# Patient Record
Sex: Male | Born: 1960 | Race: White | Hispanic: No | Marital: Married | Smoking: Never smoker
Health system: Southern US, Community
[De-identification: ages and names within clinical notes are randomized; demographics above are authoritative.]

## PROBLEM LIST (undated history)

## (undated) DIAGNOSIS — K219 Gastro-esophageal reflux disease without esophagitis: Secondary | ICD-10-CM

## (undated) DIAGNOSIS — G510 Bell's palsy: Secondary | ICD-10-CM

## (undated) DIAGNOSIS — I10 Essential (primary) hypertension: Secondary | ICD-10-CM

## (undated) DIAGNOSIS — F419 Anxiety disorder, unspecified: Secondary | ICD-10-CM

## (undated) HISTORY — DX: Gastro-esophageal reflux disease without esophagitis: K21.9

## (undated) HISTORY — DX: Bell's palsy: G51.0

## (undated) HISTORY — PX: OTHER SURGICAL HISTORY: SHX169

## (undated) HISTORY — DX: Essential (primary) hypertension: I10

---

## 2004-06-28 ENCOUNTER — Emergency Department (HOSPITAL_COMMUNITY): Admission: EM | Admit: 2004-06-28 | Discharge: 2004-06-28 | Payer: Self-pay | Admitting: Family Medicine

## 2007-12-03 ENCOUNTER — Emergency Department (HOSPITAL_COMMUNITY): Admission: EM | Admit: 2007-12-03 | Discharge: 2007-12-03 | Payer: Self-pay | Admitting: Family Medicine

## 2010-03-20 DIAGNOSIS — G510 Bell's palsy: Secondary | ICD-10-CM

## 2010-03-20 HISTORY — DX: Bell's palsy: G51.0

## 2010-03-28 ENCOUNTER — Encounter: Payer: Self-pay | Admitting: Family Medicine

## 2010-03-28 ENCOUNTER — Emergency Department (HOSPITAL_COMMUNITY): Admission: EM | Admit: 2010-03-28 | Discharge: 2010-03-29 | Payer: Self-pay | Admitting: Emergency Medicine

## 2010-03-31 ENCOUNTER — Encounter: Payer: Self-pay | Admitting: Family Medicine

## 2010-03-31 ENCOUNTER — Ambulatory Visit: Payer: Self-pay | Admitting: Internal Medicine

## 2010-03-31 DIAGNOSIS — R1013 Epigastric pain: Secondary | ICD-10-CM | POA: Insufficient documentation

## 2010-04-05 ENCOUNTER — Telehealth: Payer: Self-pay | Admitting: Family Medicine

## 2010-04-06 ENCOUNTER — Ambulatory Visit: Payer: Self-pay | Admitting: Internal Medicine

## 2010-04-06 DIAGNOSIS — G510 Bell's palsy: Secondary | ICD-10-CM | POA: Insufficient documentation

## 2010-05-07 ENCOUNTER — Ambulatory Visit: Payer: Self-pay | Admitting: Internal Medicine

## 2010-05-07 DIAGNOSIS — E669 Obesity, unspecified: Secondary | ICD-10-CM

## 2010-05-07 DIAGNOSIS — E66811 Obesity, class 1: Secondary | ICD-10-CM | POA: Insufficient documentation

## 2010-05-07 DIAGNOSIS — I10 Essential (primary) hypertension: Secondary | ICD-10-CM

## 2010-05-20 ENCOUNTER — Telehealth (INDEPENDENT_AMBULATORY_CARE_PROVIDER_SITE_OTHER): Payer: Self-pay | Admitting: *Deleted

## 2010-05-24 ENCOUNTER — Ambulatory Visit: Payer: Self-pay | Admitting: Family Medicine

## 2010-05-25 ENCOUNTER — Telehealth: Payer: Self-pay | Admitting: Family Medicine

## 2010-05-25 LAB — CONVERTED CEMR LAB
ALT: 50 units/L (ref 0–53)
AST: 31 units/L (ref 0–37)
Albumin: 3.9 g/dL (ref 3.5–5.2)
Alkaline Phosphatase: 77 units/L (ref 39–117)
BUN: 15 mg/dL (ref 6–23)
Bilirubin, Direct: 0.2 mg/dL (ref 0.0–0.3)
CO2: 28 meq/L (ref 19–32)
Calcium: 9.3 mg/dL (ref 8.4–10.5)
Chloride: 101 meq/L (ref 96–112)
Cholesterol: 169 mg/dL (ref 0–200)
Creatinine, Ser: 1.3 mg/dL (ref 0.4–1.5)
GFR calc non Af Amer: 62.28 mL/min (ref >30.00)
Glucose, Bld: 102 mg/dL — ABNORMAL HIGH (ref 70–99)
HDL: 30.8 mg/dL — ABNORMAL LOW (ref >39.00)
LDL Cholesterol: 102 mg/dL — ABNORMAL HIGH (ref 0–99)
Potassium: 3.7 meq/L (ref 3.5–5.1)
Sodium: 138 meq/L (ref 135–145)
TSH: 1.1 microintl units/mL (ref 0.35–5.50)
Total Bilirubin: 1.5 mg/dL — ABNORMAL HIGH (ref 0.3–1.2)
Total CHOL/HDL Ratio: 5
Total Protein: 6.4 g/dL (ref 6.0–8.3)
Triglycerides: 181 mg/dL — ABNORMAL HIGH (ref 0.0–149.0)
VLDL: 36.2 mg/dL (ref 0.0–40.0)

## 2010-06-07 ENCOUNTER — Ambulatory Visit: Payer: Self-pay | Admitting: Family Medicine

## 2010-06-07 DIAGNOSIS — E785 Hyperlipidemia, unspecified: Secondary | ICD-10-CM | POA: Insufficient documentation

## 2010-06-07 LAB — CONVERTED CEMR LAB

## 2010-06-08 LAB — CONVERTED CEMR LAB
BUN: 13 mg/dL (ref 6–23)
CO2: 30 meq/L (ref 19–32)
Calcium: 9.8 mg/dL (ref 8.4–10.5)
Chloride: 101 meq/L (ref 96–112)
Creatinine, Ser: 1.2 mg/dL (ref 0.4–1.5)
GFR calc non Af Amer: 67.69 mL/min (ref >60.00)
Glucose, Bld: 113 mg/dL — ABNORMAL HIGH (ref 70–99)
Potassium: 4.5 meq/L (ref 3.5–5.1)
Sodium: 140 meq/L (ref 135–145)

## 2010-06-23 ENCOUNTER — Ambulatory Visit
Admission: RE | Admit: 2010-06-23 | Discharge: 2010-06-23 | Payer: Self-pay | Source: Home / Self Care | Attending: Family Medicine | Admitting: Family Medicine

## 2010-07-20 NOTE — Progress Notes (Signed)
----   Converted from flag ---- ---- 05/19/2010 4:13 PM, Eustaquio Boyden  MD wrote: FLP, CMP, TSH, 278.00, 401.9  ---- 05/19/2010 10:49 AM, Liane Comber CMA (AAMA) wrote: Lab orders please! Good Morning! This pt is scheduled for fasting labs Monday, which labs to draw and dx codes to use? Thanks Tasha ------------------------------

## 2010-07-20 NOTE — Assessment & Plan Note (Signed)
Summary: NEW PT TO EST/CLE   Vital Signs:  Patient profile:   50 year old male Height:      69.5 inches Weight:      223.25 pounds BMI:     32.61 Temp:     98.8 degrees F oral Pulse rate:   60 / minute Pulse rhythm:   regular BP sitting:   138 / 90  (left arm) Cuff size:   large  Vitals Entered By: Selena Batten Dance CMA Duncan Dull) (March 31, 2010 10:35 AM) CC: New patient to establish care   History of Present Illness: CC: new patient establishcare, sunday night episode  never seen doctor.  1. Sunday night episode - severe pain under sternum, epigastric, no radiation, felt like lump stuck in chest.  Not pressure/tightness.  Not burning pain.  Unable to get comfortable.  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 initial BP 150/100 with EMS.  Went to hospital, told normal EKG, not heart.  Nitro x 2 didn't help.  prior to d/c from hospital bp 138/80.  Started on prilosec and pain gone.  Hasn't really had heartburn/reflux in past.  Tums didn't help.  Recently lost friend last week.  increased stress recently at work.  CP not exertional, not relieved by rest.  Not reproducible.    Went to [facility].  records reviewed - EKG scanned into system - sinus brady @ 51 BPM with PAC.  CMP WNL, CBC with WBC 12.1, ANC 10.2 o/w WNL, lipase WNL, CXR WNL.  pain improved with dilaudid and percocets.  declined further imaging 2/2 cost.  preventative:  tetanus 2009 flu - declines Colon - requests iFOB prostate: will discuss with wife and let us know.  requests [facility] labs.  -  Date:  12/03/2007    TD booster Td  Current Medications (verified): 1)  Prilosec Otc 20 Mg Tbec (Omeprazole Magnesium) .... One Daily For Reflux  Allergies (verified): 1)  ! 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  The patient denies anorexia, fever, weight loss, weight gain, vision loss, decreased hearing, hoarseness, syncope, dyspnea on exertion, peripheral edema, prolonged cough, headaches, hemoptysis, abdominal pain, melena, hematochezia, hematuria, muscle weakness, suspicious skin lesions, transient blindness, difficulty walking, depression, and testicular masses.    Physical Exam  General:  Well-developed,well-nourished,in no acute distress; alert,appropriate and cooperative throughout examination Head:  Normocephalic and atraumatic without obvious abnormalities. No apparent alopecia or balding. Eyes:  No corneal or conjunctival inflammation noted. EOMI. Perrla. Ears:  External ear exam shows no significant lesions or deformities.  Otoscopic examination reveals clear canals, tympanic membranes are intact bilaterally without bulging, retraction, inflammation or discharge. Hearing is grossly normal bilaterally. Nose:  External nasal examination shows no deformity or inflammation. Nasal mucosa are pink and moist without lesions or exudates. Mouth:  Oral mucosa and oropharynx without lesions or exudates.  Teeth in good repair. Neck:  No deformities, masses, or tenderness noted.  no bruits Lungs:  Normal respiratory effort, chest expands symmetrically. Lungs are clear to auscultation, no crackles or wheezes. Heart:  Normal rate and  regular rhythm. S1 and S2 normal without gallop, murmur, click, rub or other extra sounds. Abdomen:  Bowel sounds positive,abdomen soft and non-tender without masses, organomegaly or hernias noted. Rectal:  declined Msk:  No deformity or scoliosis noted of thoracic or lumbar spine.   Pulses:  2 rad pulses Extremities:  no edema Neurologic:  CN grossly intact, station and gait  intact Skin:  Intact without suspicious lesions or rashes Psych:  full affect   Impression & Recommendations:  Problem # 1:  ABDOMINAL PAIN, EPIGASTRIC (ICD-789.06) does not sound cardiac in nature (not typical.)   no strong family hx CAD, no h/o HTN, DM, smoking. + obesity.  check FLP to further risk stratify.  other differential includes GERD, ulcer, gallbladder disease, thoracic aneurysm.  Currently improved after taking prilosec.  Continue for now.  return for f/u in 1 mo.  however, discussed that if continues to have episodes will need to send to cardiolgoy for formal evaluation of cardiac status.    Problem # 2:  SPECIAL SCREENING FOR MALIGNANT NEOPLASMS COLON (ICD-V76.51) iFOB today.  Problem # 3:  SPECIAL SCREENING MALIGNANT NEOPLASM OF PROSTATE (ICD-V76.44) to discuss with wife and decide.  Problem # 4:  Preventive Health Care (ICD-V70.0)  declines flu shot.  Complete Medication List: 1)  Prilosec Otc 20 Mg Tbec (Omeprazole magnesium) .... One daily for reflux  Patient Instructions: 1)  Return in 1-2 months for recheck blood pressure. 2)  blood work at [facility].  Think about prostate screening.  stool study sent home today. 3)  return in next week fasting for blood work [FLP, BMP, TSH, 278.00]. 4)  Good to meet you today, call clinic with questions. 5)  Reflux precautions:  Head of bed elevated. 6)  Avoidance of citrus, fatty foods, chocolate, peppermint, and excessive alcohol, along with sodas, orange juice (acidic drinks) 7)  At least a few hours between dinner and bed, minimize naps after eating. Prescriptions: PRILOSEC OTC 20 MG TBEC (OMEPRAZOLE MAGNESIUM) one daily for reflux  #30 x 3   Entered and Authorized by:   Eustaquio Boyden  MD   Signed by:   Eustaquio Boyden  MD on 03/31/2010   Method used:   Historical   RxID:   [id number]   Prior Medications: Current Allergies (reviewed today): ! PCN

## 2010-07-20 NOTE — Assessment & Plan Note (Signed)
Summary: ? Bell's Palsey//kad   Vital Signs:  Patient profile:   50 year old male Weight:      223.25 pounds Temp:     98.0 degrees F oral Pulse rate:   80 / minute Pulse rhythm:   regular BP sitting:   130 / 90  (left arm) Cuff size:   large  Vitals Entered By: Selena Batten Dance CMA (AAMA) (April 06, 2010 9:30 AM) CC: ? Bells Palsey Comments Right-sided facial numbness since last PM. Progressively worsening throughout the night and into the morning. Just started HCTZ this AM   History of Present Illness: CC: facial numbness/weakness  Ate spicy bratwurst last night.  after dinner, started with facial numbness, spreading this morning.  Noticed muscles twitching rigt side of head initially.  Now trouble shutting right eye.  Trouble with speech on right side of face, 2/2 numbness.  No numbness or weakness in arms or legs.  No HA, vision changes.  No dizziness.  No tick exposure pt knows of.  No recent abd pain, diarrhea/vomiting.  Does get fever blisters occasionally  No recent viral illnesses.  No recent fevers/chills.  Never had anything like this before.    Allergies: 1)  ! 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 No apparent alopecia or balding. Eyes:  No corneal or conjunctival inflammation noted. EOMI. Perrla. Ears:  TMs normal, no lesions in ear canals Nose:  nares clear Mouth:  + cobblestoning pharynx Neck:  No deformities, masses, or tenderness noted.  no bruits Pulses:  2 rad pulses Extremities:  no edema Neurologic:   Right facial nerve palsy - sagging lip, slight slurred speech, weakness with closing eye and raising eyebrow and puffing cheek on right.  sensation diminished on right compared to left.  All other CN intact (hearing, eye movement, tongue movement, shoulder movement).  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  treat with course of steroids.  return in 3 wks for f/u (previously scheduled appt).  if at least some function has not returned by 3 mo, would merit further investigation.  red flags to return sooner discussed (ie weakness in rest of body, spreading to other side of face, HA, vision cahgnes, fevers, other concerns)  Complete Medication List: 1)  Prilosec Otc 20 Mg Tbec (Omeprazole magnesium) .... One daily for reflux 2)  Hydrochlorothiazide 12.5 Mg Caps (Hydrochlorothiazide) .... Take one daily for blood pressure 3)  Prednisone 20 Mg Tabs (Prednisone) .... Take three daily x 7 days (use this dose)  Patient Instructions: 1)  Looks like you have a case of Bell's Palsy 2)  watch out for pain, affecting other side of face, or any part of body, headaches, or other concerns.  If this happens, give [place] a call or return to be seen sooner.   3)  Course of steroids to hopefully make bell's palsy hopefully improve quicker. 4)  Lubricating eye drops if trouble  closing right eye. 5)  See you in 3 weeks. Prescriptions: PREDNISONE 20 MG TABS (PREDNISONE) take three daily x 7 days (use this dose)  #21 x 0   Entered and Authorized by:   Eustaquio Boyden  MD   Signed by:   Eustaquio Boyden  MD on 04/06/2010   Method used:   [facility]        [facility]       [address]       [place], [place]  [address]       Ph: [phone] or [phone]       Fax: [phone]    RxID:   [id number] PREDNISONE 20 MG TABS (PREDNISONE) take two daily x 7 days  #14 x 0   Entered and Authorized by:   Eustaquio Boyden  MD   Signed by:   Eustaquio Boyden  MD on 04/06/2010   Method used:   [facility]        [facility] [id number]       [address]       [place], [place]  [address]       Ph: [phone] or [phone]       Fax: [phone]   RxID:   [id number]    Orders Added: 1)  Est. Patient Level III [[address]]    Current Allergies (reviewed today): ! PCN  Appended Document: ? 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  However would like to talk with patient and offer antiviral.  Eustaquio Boyden  MD  April 07, 2010 7:49 AM   attempted to call x 2 yesterday, today x 1.  Left message today, asking pt to call back. Eustaquio Boyden  MD  April 07, 2010 7:49 AM   Called work #, left message. Eustaquio Boyden  MD  April 07, 2010 11:28 AM   spoke to patient- he would like to try valtrex.  advised it is at pharmacy.  If fills today, would be within 72hours of start.  Pt says he has started taking B12 to see if it will help as well. Eustaquio Boyden  MD  April 07, 2010 12:25 PM     Clinical Lists Changes  Medications: Added new medication of VALACYCLOVIR HCL 1 GM TABS (VALACYCLOVIR HCL) take one by mouth three times a day x 7 days - Signed Rx of VALACYCLOVIR HCL 1 GM TABS (VALACYCLOVIR HCL) take one by mouth three times a day x 7 days;  #21 x 0;  Signed;  Entered by: Eustaquio Boyden  MD;  Authorized by: Eustaquio Boyden  MD;  Method used: [facility] [place], [place]  [address], Ph: [phone] or [phone], Fax: [phone]    Prescriptions: VALACYCLOVIR HCL 1 GM TABS (VALACYCLOVIR HCL) take  one by mouth three times a day x 7 days  #21 x 0   Entered and Authorized by:   Eustaquio Boyden  MD   Signed by:   Eustaquio Boyden  MD on 04/07/2010   Method used:   [facility]        [facility] [address] [facility]ail)       [address]       [place], [place]  [address]       Ph: [phone] or [phone]  Fax: [phone]   RxID:   [id number]

## 2010-07-20 NOTE — Progress Notes (Signed)
Summary: iFOB update  Phone Note From Other Clinic   Caller: [name] @ [employer] of Call: ifob never returned, I spoke with the patient today and he will find the kit at home and complete in the next couple of days. Initial call taken by: Mills Koller,  May 25, 2010 3:10 PM  Follow-up for Phone Call        thanks. Follow-up by: Eustaquio Boyden  MD,  May 25, 2010 3:23 PM

## 2010-07-20 NOTE — Assessment & Plan Note (Signed)
Summary: ROA 1 MTHS CYD   Vital Signs:  Patient profile:   50 year old male Weight:      217.50 pounds Temp:     98.4 degrees F oral Pulse rate:   66 / minute Pulse rhythm:   regular BP sitting:   136 / 90  (left arm) Cuff size:   large  Vitals Entered By: Selena Batten Dance CMA Duncan Dull) (May 07, 2010 8:19 AM) CC: 1 month follow up   History of Present Illness: CC: f/u bell's palsy, other issues.   bell's palsy - seen 1 mo ago with R facial weakness, dx with bells palsy and treated with course of steroids and valtrex.  also started taking B12.  presents today for f/u - speech almost 100% back to normal.  only residual is trouble closing R eye, o/w resolving well.  Still taking B12.  Started noticing improvement 1 wk ago.   cold - 2d h/o congestion, dry cough, cold.  No fevers/chills, abd pain, rashes, myalgias or arthralgias.  Tried dayquil.    HTN - started last visit on HCTZ.  both numbers still running a bit high.  lots of strenous work last few weeks.  No chest tightness/pressure with this.  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  GERD - taking prilosec every morning.  noted improvement in chest burning.  iFOB - hasn't returned.  has been busy with completing fire training classes at [employer].  Current Medications (verified): 1)  Prilosec Otc 20 Mg Tbec (Omeprazole Magnesium) .... One Daily For Reflux 2)  Lisinopril-Hydrochlorothiazide 10-12.5 Mg Tabs (Lisinopril-Hydrochlorothiazide) .... Take One Daily For Blood Pressure  Allergies: 1)  ! Pcn  Past History:  Past Medical History: HTN bell's palsy (03/2010)  Social History: Reviewed history from 03/31/2010 and no changes required. 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 No apparent alopecia or balding. Eyes:  No corneal or conjunctival inflammation noted. EOMI. Perrla. Ears:  TMs normal, no lesions in ear canals Nose:  nares clear Mouth:  + cobblestoning pharynx Neck:  No deformities, masses, or tenderness noted.  no bruits Lungs:  Normal respiratory effort, chest expands symmetrically. Lungs are clear to auscultation, no crackles or wheezes. Heart:  Normal rate and regular rhythm. S1 and S2 normal without gallop, murmur, click, rub or other extra sounds. Msk:  No deformity or scoliosis noted of thoracic or lumbar spine.   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  continue to monitor.  good prognosis for return of function.  Problem # 2:  HYPERTENSION (ICD-401.9) awaiting blood work in 2 wks.  no change with hCTZ.  add lisinopril.  check Cr in 2 wks when returns for blood work.    His updated medication list for this problem includes:    Lisinopril-hydrochlorothiazide 10-12.5 Mg Tabs (Lisinopril-hydrochlorothiazide) .Marland Kitchen... Take one daily for blood pressure  BP today: 136/90 Prior BP: 130/90 (04/06/2010)  Problem # 3:  OBESITY (ICD-278.00) awaiting return for blood work. Ht: 69.5 (03/31/2010)   Wt: 217.50 (05/07/2010)   BMI: 32.61 (03/31/2010)  Problem # 4:  VIRAL  URI (ICD-465.9) supportive care for now.  rec return if not improving as expected.  Complete Medication List: 1)  Prilosec Otc 20 Mg Tbec (Omeprazole magnesium) .... One daily for reflux 2)  Lisinopril-hydrochlorothiazide 10-12.5 Mg Tabs (Lisinopril-hydrochlorothiazide) .... Take one daily for blood pressure  Patient Instructions: 1)  return in 1 month for follow up. 2)  For cold - push fluids, and plenty of rest. 3)  I'm glad the facial weakness is getting better!  This is  reassuring.   4)  return stool kit 5)  Start lisinopril/HCTZ - one daily. 6)  Return in 2-3 wks for fasting labs. Prescriptions: LISINOPRIL-HYDROCHLOROTHIAZIDE 10-12.5 MG TABS (LISINOPRIL-HYDROCHLOROTHIAZIDE) take one daily for blood pressure  #30 x 3   Entered and Authorized by:   Eustaquio Boyden  MD   Signed by:   Eustaquio Boyden  MD on 05/07/2010   Method used:   [facility]        [id number]       [address]       [place], [place]  [address]       Ph: [phone] or [phone]       Fax: [phone]   RxID:   [id number]    Orders Added: 1)  Est. Patient Level IV [[address]]    Current Allergies (reviewed today): ! PCN

## 2010-07-20 NOTE — Letter (Signed)
Summary: Work Dietitian at [employer]  [address] [place], [place] [address]   Phone: [phone]  Fax: [phone]    Today's Date: March 31, 2010  Name of Patient: Greg Wallace  The above named patient had a medical visit today at:  am / pm.  Please take this into consideration when reviewing the time away from work/school.    Special Instructions:  [  ] None  [  ] To be off the remainder of today, returning to the normal work / school schedule tomorrow.  [  ] To be off until the next scheduled appointment on ______________________.  [ X ] Other: patient may return to work without restrictions.   Sincerely yours,   Eustaquio Boyden  MD

## 2010-07-20 NOTE — Letter (Signed)
Summary: [facility]: Immunoassay Fecal Occult Blood (iFOB) Order Form  [place] at [facility]  [address] [place], [place] [address]   Phone: [phone]  Fax: [phone]      [facility]: Immunoassay Fecal Occult Blood (iFOB) Order Form   March 31, 2010 MRN: [id number]   Greg Wallace Nov 12, 1960   Physicican Name:_________________________  Diagnosis Code:__________V76.49________________      Greg Boyden  MD

## 2010-07-20 NOTE — Progress Notes (Signed)
Summary: wants to start BP medicaton   Phone Note Call from Patient Call back at Work Phone [phone]   Caller: Patient Call For: Greg Boyden  MD Summary of Call: Patient says that he has been checking his blood pressure everyday since office visit. It has been running high each time. He only recorded tow of the readings. Yesterday it was 146/88 and today it was 148/88. He is asking if he could go ahead and start BP medication. [facility]  Initial call taken by: Melody Comas,  April 05, 2010 2:33 PM  Follow-up for Phone Call        ok to start HCTZ 12.5mg  daily.  rec keep appt in 1-2 months for f/u.  inform water pill so may notice voiding more  Follow-up by: Greg Boyden  MD,  April 05, 2010 2:44 PM  Additional Follow-up for Phone Call Additional follow up Details #1::        Patient notified and will keep a B/P log to bring to his follow visit in November.  Additional Follow-up by: Janee Morn CMA Duncan Dull),  April 05, 2010 2:55 PM    New/Updated Medications: HYDROCHLOROTHIAZIDE 12.5 MG CAPS (HYDROCHLOROTHIAZIDE) take one daily for blood pressure Prescriptions: HYDROCHLOROTHIAZIDE 12.5 MG CAPS (HYDROCHLOROTHIAZIDE) take one daily for blood pressure  #30 x 3   Entered and Authorized by:   Greg Boyden  MD   Signed by:   Greg Boyden  MD on 04/05/2010   Method used:   [facility]        [facility] [address] [facility])       [address]       [place], [place]  [address]       Ph: [phone] or [phone]       Fax: [phone]   RxID:   [id number]

## 2010-07-22 NOTE — Letter (Signed)
Summary: Out of Work  [employer] at [facility]  [address] [place], [place] [address]   Phone: [phone]  Fax: [phone]    June 07, 2010   Employee:  Cloud E Rae    To Whom It May Concern:   For Medical reasons, please excuse the above named employee from work for the following dates:  Start:  June 07, 2010 AM  End:  June 07, 2010  AM  If you need additional information, please feel free to contact our office.         Sincerely,    Eustaquio Boyden  MD

## 2010-07-22 NOTE — Assessment & Plan Note (Signed)
Summary: CPX and 1 month follow up   Vital Signs:  Patient profile:   50 year old male Weight:      221 pounds BMI:     32.28 Temp:     98.5 degrees F oral Pulse rate:   76 / minute Pulse rhythm:   regular BP sitting:   122 / 80  (left arm) Cuff size:   large  Vitals Entered By: Selena Batten Dance CMA Duncan Dull) (June 07, 2010 8:32 AM) CC: CPx and follow up   History of Present Illness: CC: CPE  May be out of job end of 11/2009.  looking for new one.  Bell's Palsy - about 80% better.  Still eye not closing fully on R side.  otherwise thinks other things have returned to normal.  Taking bp meds, baby ASA and B50 tablet daily.  HTN - tolerating meds well.  taking in AM.  no HA, vision changes, chest pain, tightness, urinary changes, LE swelling.    weight - up a few pounds.  trying to stay off sodas, increase water.  GERD - taking prilosec daily.  no reflux sxs.  no further chest discomfort.  Preventative: prostate screening - no family members with cancer.  requests to postpone prostate screen (PSA and DRE) to next year (financial issues, etc).  No nocturia, strong stream. colon screening - No BM changes, no blood in stool.  iFOB pending (already sent in). declines flu shot.  Preventive Screening-Counseling & Management  Alcohol-Tobacco     Smoking Status: never  Current Medications (verified): 1)  Aspirin 81 Mg Tbec (Aspirin) .... Take One Daily 2)  Prilosec Otc 20 Mg Tbec (Omeprazole Magnesium) .... One Daily For Reflux As Needed 3)  Lisinopril-Hydrochlorothiazide 10-12.5 Mg Tabs (Lisinopril-Hydrochlorothiazide) .... Take One Daily For Blood Pressure 4)  B-50 Cr  Cr-Tabs (B Complex-Folic Acid) .... One Daily  Allergies: 1)  ! Pcn  Past History:  Family History: Last updated: 06/07/2010 M: BRCA, HTN F: lung CA (smoking)  No other CA, DM, CAD/MI, CVA  Social History: Last updated: 03/31/2010 No smoking, no EtOH, no rec drugs Occupation: fireman Lives with wife  [name]), 3 dogs, 3 horses, 1 cat  Past Medical History: HTN R bell's palsy (03/2010)  Family History: M: BRCA, HTN F: lung CA (smoking)  No other CA, DM, CAD/MI, CVA  Social History: Smoking Status:  never  Review of Systems  The patient denies anorexia, fever, weight loss, weight gain, vision loss, decreased hearing, hoarseness, chest pain, syncope, dyspnea on exertion, peripheral edema, prolonged cough, headaches, hemoptysis, abdominal pain, melena, hematochezia, severe indigestion/heartburn, hematuria, transient blindness, difficulty walking, depression, and testicular masses.    Physical Exam  General:  Well-developed,well-nourished,in no acute distress; alert,appropriate and cooperative throughout examination Head:  Normocephalic and atraumatic without obvious abnormalities. No apparent alopecia or balding. Eyes:  No corneal or conjunctival inflammation noted. EOMI. Perrla. Ears:  TMs normal, no lesions in ear canals Nose:  nares clear Mouth:  no erythema/edema/exudates.  MMM Neck:  No deformities, masses, or tenderness noted.  no bruits, no LAD Lungs:  Normal respiratory effort, chest expands symmetrically. Lungs are clear to auscultation, no crackles or wheezes. Heart:  Normal rate and regular rhythm. S1 and S2 normal without gallop, murmur, click, rub or other extra sounds. Abdomen:  Bowel sounds positive,abdomen soft and non-tender without masses, organomegaly or hernias noted. Msk:  No deformity or scoliosis noted of thoracic or lumbar spine.   Pulses:  2+ rad pulses Extremities:  no pedal edema  Neurologic:  slight R residual facial nerve palsy. CN otherwise grossly intact, station and gait intact Skin:  Intact without suspicious lesions or rashes Psych:  full affect, pleasant and cooperative.   Impression & Recommendations:  Problem # 1:  HYPERTENSION (ICD-401.9) better control, tolerating meds.  check Cr today.  His updated medication list for this problem  includes:    Lisinopril-hydrochlorothiazide 10-12.5 Mg Tabs (Lisinopril-hydrochlorothiazide) .Marland Kitchen... Take one daily for blood pressure  Orders: TLB-BMP (Basic Metabolic Panel-BMET) (80048-METABOL)  BP today: 122/80 Prior BP: 136/90 (05/07/2010)  Labs Reviewed: K+: 3.7 (05/24/2010) Creat: : 1.3 (05/24/2010)   Chol: 169 (05/24/2010)   HDL: 30.80 (05/24/2010)   LDL: 102 (05/24/2010)   TG: 181.0 (05/24/2010)  Problem # 2:  OBESITY (ICD-278.00) reviewed BMI.  weight gain attributed to holiday parties.  pt making conscious effort to decrease caffeine, watch diet, increase walking with hunting club  Ht: 69.5 (03/31/2010)   Wt: 221 (06/07/2010)   BMI: 32.28 (06/07/2010)  Problem # 3:  HEALTH MAINTENANCE EXAM (ICD-V70.0)  Reviewed preventive care protocols, scheduled due services, and updated immunizations.  declines flu, declines prostate.  Problem # 4:  SPECIAL SCREENING MALIGNANT NEOPLASM OF PROSTATE (ICD-V76.44) declines psa, dre, requests discussion next year.  Problem # 5:  SPECIAL SCREENING FOR MALIGNANT NEOPLASMS COLON (ICD-V76.51) iFOB pending.  pt states he already turned in.  Problem # 6:  DYSLIPIDEMIA (ICD-272.4) borderline with low HDL, elevated trig.  recommend weight loss.  Labs Reviewed: SGOT: 31 (05/24/2010)   SGPT: 50 (05/24/2010)   HDL:30.80 (05/24/2010)  LDL:102 (05/24/2010)  Chol:169 (05/24/2010)  Trig:181.0 (05/24/2010)  Complete Medication List: 1)  Aspirin 81 Mg Tbec (Aspirin) .... Take one daily 2)  Prilosec Otc 20 Mg Tbec (Omeprazole magnesium) .... One daily for reflux as needed 3)  Lisinopril-hydrochlorothiazide 10-12.5 Mg Tabs (Lisinopril-hydrochlorothiazide) .... Take one daily for blood pressure 4)  B-50 Cr Cr-tabs (B complex-folic acid) .... One daily  Patient Instructions: 1)  Please return in 3-6 months, sooner if needed. 2)  Good to see you today.  Call clinic with uqestions. Prescriptions: LISINOPRIL-HYDROCHLOROTHIAZIDE 10-12.5 MG TABS  (LISINOPRIL-HYDROCHLOROTHIAZIDE) take one daily for blood pressure  #90 x 3   Entered and Authorized by:   Eustaquio Boyden  MD   Signed by:   Eustaquio Boyden  MD on 06/07/2010   Method used:   Print then Give to Patient   RxID:   [id number] LISINOPRIL-HYDROCHLOROTHIAZIDE 10-12.5 MG TABS (LISINOPRIL-HYDROCHLOROTHIAZIDE) take one daily for blood pressure  #90 x 3   Entered and Authorized by:   Eustaquio Boyden  MD   Signed by:   Eustaquio Boyden  MD on 06/07/2010   Method used:   Print then Give to Patient   RxID:   [id number] LISINOPRIL-HYDROCHLOROTHIAZIDE 10-12.5 MG TABS (LISINOPRIL-HYDROCHLOROTHIAZIDE) take one daily for blood pressure  #90 x 3   Entered and Authorized by:   Eustaquio Boyden  MD   Signed by:   Eustaquio Boyden  MD on 06/07/2010   Method used:   Electronically to        [facility] (retail)       [address]       [place], [place]  [address]       Ph: [phone] or [phone]       Fax: [phone]   RxID:   [id number]    Orders Added: 1)  TLB-BMP (Basic Metabolic Panel-BMET) [80048-METABOL] 2)  Est. Patient 40-64 years [61443]    Current Allergies (reviewed today): ! PCN  Prevention & Chronic Care Immunizations   Influenza vaccine: Not documented   Influenza vaccine deferral: Refused  (06/07/2010)    Tetanus booster: 12/03/2007: Td   Tetanus booster due: 12/02/2017    Pneumococcal vaccine: Not documented  Colorectal Screening   Hemoccult: Not documented    Colonoscopy: Not documented   Colonoscopy action/deferral: Deferred  (06/07/2010)  Other Screening   PSA: Not documented   PSA action/deferral: Discussed-decision deferred  (06/07/2010)   Smoking status: never  (06/07/2010)  Lipids   Total Cholesterol: 169  (05/24/2010)   LDL: 102  (05/24/2010)   LDL Direct: Not documented   HDL: 30.80  (05/24/2010)   Triglycerides: 181.0  (05/24/2010)    SGOT (AST): 31  (05/24/2010)   SGPT (ALT): 50   (05/24/2010)   Alkaline phosphatase: 77  (05/24/2010)   Total bilirubin: 1.5  (05/24/2010)    Lipid flowsheet reviewed?: Yes   Progress toward LDL goal: Unchanged  Hypertension   Last Blood Pressure: 122 / 80  (06/07/2010)   Serum creatinine: 1.3  (05/24/2010)   Serum potassium 3.7  (05/24/2010)    Hypertension flowsheet reviewed?: Yes   Progress toward BP goal: At goal  Self-Management Support :   Personal Goals (by the next clinic visit) :      Personal blood pressure goal: 140/90  (06/07/2010)     Personal LDL goal: 130  (06/07/2010)    Hypertension self-management support: Not documented    Lipid self-management support: Not documented

## 2010-09-01 LAB — DIFFERENTIAL
Basophils Absolute: 0 10*3/uL (ref 0.0–0.1)
Basophils Relative: 0 % (ref 0–1)
Eosinophils Absolute: 0 10*3/uL (ref 0.0–0.7)
Eosinophils Relative: 0 % (ref 0–5)
Lymphocytes Relative: 11 % — ABNORMAL LOW (ref 12–46)
Lymphs Abs: 1.3 10*3/uL (ref 0.7–4.0)
Monocytes Absolute: 0.6 10*3/uL (ref 0.1–1.0)
Monocytes Relative: 5 % (ref 3–12)
Neutro Abs: 10.2 10*3/uL — ABNORMAL HIGH (ref 1.7–7.7)
Neutrophils Relative %: 84 % — ABNORMAL HIGH (ref 43–77)

## 2010-09-01 LAB — COMPREHENSIVE METABOLIC PANEL
ALT: 31 U/L (ref 0–53)
AST: 22 U/L (ref 0–37)
Albumin: 3.8 g/dL (ref 3.5–5.2)
Alkaline Phosphatase: 64 U/L (ref 39–117)
BUN: 12 mg/dL (ref 6–23)
CO2: 31 mEq/L (ref 19–32)
Calcium: 9.8 mg/dL (ref 8.4–10.5)
Chloride: 106 mEq/L (ref 96–112)
Creatinine, Ser: 1.1 mg/dL (ref 0.4–1.5)
GFR calc Af Amer: >60 mL/min (ref >60)
GFR calc non Af Amer: >60 mL/min (ref >60)
Glucose, Bld: 141 mg/dL — ABNORMAL HIGH (ref 70–99)
Potassium: 3.8 mEq/L (ref 3.5–5.1)
Sodium: 142 mEq/L (ref 135–145)
Total Bilirubin: 1.1 mg/dL (ref 0.3–1.2)
Total Protein: 6.6 g/dL (ref 6.0–8.3)

## 2010-09-01 LAB — CBC
HCT: 45.5 % (ref 39.0–52.0)
Hemoglobin: 15.9 g/dL (ref 13.0–17.0)
MCH: 30.1 pg (ref 26.0–34.0)
MCHC: 34.9 g/dL (ref 30.0–36.0)
MCV: 86 fL (ref 78.0–100.0)
Platelets: 184 10*3/uL (ref 150–400)
RBC: 5.29 MIL/uL (ref 4.22–5.81)
RDW: 12.4 % (ref 11.5–15.5)
WBC: 12.1 10*3/uL — ABNORMAL HIGH (ref 4.0–10.5)

## 2010-09-01 LAB — LIPASE, BLOOD: Lipase: 24 U/L (ref 11–59)

## 2010-10-26 ENCOUNTER — Telehealth: Payer: Self-pay | Admitting: *Deleted

## 2010-10-26 ENCOUNTER — Encounter: Payer: Self-pay | Admitting: Family Medicine

## 2010-10-26 NOTE — Telephone Encounter (Signed)
pepcid or zantac may help a little.  Thanks.

## 2010-10-26 NOTE — Telephone Encounter (Signed)
Advised pt

## 2010-10-26 NOTE — Telephone Encounter (Signed)
Pt is coming in to see you tomorrow for severe heartburn.  He has been taking 2 prilosec at a time, taking mylanta and sleeping propped up.  He is asking if you can suggest anything else that he can do to help him get through the night tonight.  Please advise.

## 2010-10-27 ENCOUNTER — Encounter: Payer: Self-pay | Admitting: Family Medicine

## 2010-10-27 ENCOUNTER — Ambulatory Visit (INDEPENDENT_AMBULATORY_CARE_PROVIDER_SITE_OTHER): Payer: 59 | Admitting: Family Medicine

## 2010-10-27 ENCOUNTER — Ambulatory Visit: Payer: Self-pay | Admitting: Family Medicine

## 2010-10-27 ENCOUNTER — Other Ambulatory Visit: Payer: Self-pay | Admitting: *Deleted

## 2010-10-27 VITALS — BP 110/78 | HR 80 | Temp 98.2°F | Wt 214.0 lb

## 2010-10-27 DIAGNOSIS — R1013 Epigastric pain: Secondary | ICD-10-CM

## 2010-10-27 MED ORDER — OMEPRAZOLE 20 MG PO CPDR: 20.0000 mg | DELAYED_RELEASE_CAPSULE | Freq: Two times a day (BID) | ORAL | Status: DC

## 2010-10-27 NOTE — Progress Notes (Signed)
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  Was taking prilosec daily prev and was doing well.  Gradually tapered to q3 days and then off the medicine.  Wednesday night 1 week ago pain in the epigastrum.  No help with prilosec and milk at that point.  Finally was able to get back to sleep. Thursday AM vomited.  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  Return of pain Sunday night.  He put an ice pack on his chest and that helped some.  Both times- pain came on at rest.  He vomited after both episodes and that helped some.  Had been eating a lot of fast food and spicy/greasy foods.  "it's a stressful job."  Nonsmoker.  No drinking.  No drugs.    He had gone to the hospital with an episode in the past and had unremarkable eval and EKG.    No pain currently.  Prev with no radiation, not sob, no exertional sx.    Meds, vitals, and allergies reviewed.   ROS: See HPI.  Otherwise, noncontributory.  GEN: nad, alert and oriented HEENT: mucous membranes moist NECK: supple w/o LA CV: rrr PULM: ctab, no inc wob ABD: soft, +bs, epigastrum minimally ttp EXT: no edema SKIN: no acute rash

## 2010-10-27 NOTE — Patient Instructions (Signed)
Take prilosec twice a day.  Prop up the head of your bed.  Avoid coffee, soda, tea, chocolate, caffeine, fatty/fried foods and large meals.   Schedule a follow up appointment in: 1 week with Dr. [name].

## 2010-10-27 NOTE — Assessment & Plan Note (Addendum)
GERD flare likely.  He has a benign exam and is okay for outpatient fu.  He had sx that were bad enough to produce vomiting.  D/w pt about diet, elevating the head of bed.  I held him out of work through next week.  He needs to rest, change his diet, take bid PPI and then fu with PMD.  He understands. Fu as planned, call back as needed in the meantime.

## 2010-11-02 ENCOUNTER — Ambulatory Visit (INDEPENDENT_AMBULATORY_CARE_PROVIDER_SITE_OTHER): Payer: 59 | Admitting: Family Medicine

## 2010-11-02 ENCOUNTER — Encounter: Payer: Self-pay | Admitting: Family Medicine

## 2010-11-02 VITALS — BP 110/78 | HR 80 | Temp 98.3°F | Wt 219.0 lb

## 2010-11-02 DIAGNOSIS — R1013 Epigastric pain: Secondary | ICD-10-CM

## 2010-11-02 MED ORDER — OMEPRAZOLE 40 MG PO CPDR: 40.0000 mg | DELAYED_RELEASE_CAPSULE | Freq: Every day | ORAL | Status: DC

## 2010-11-02 NOTE — Assessment & Plan Note (Addendum)
GERD vs gastritis, however controlled on prilosec.  Continue prilosec long term.   No red flags.   If not controlled with this, referral to GI for EGD. Consider checking H pylori.

## 2010-11-02 NOTE — Patient Instructions (Signed)
Start prilosec 40mg  daily (provided with script for 90 days). Head of bed elevated. Avoidance of citrus, fatty foods, chocolate, peppermint, and excessive alcohol, along with sodas, orange juice (acidic drinks) At least a few hours between dinner and bed, minimize naps after eating. Return in December for next physical unless needed prior.

## 2010-11-02 NOTE — Progress Notes (Signed)
  Subjective:    Patient ID: Greg Wallace, male    DOB: Jun 11, 1961, 50 y.o.   MRN: [id number]  HPI CC: epigastric pain  Seen last week with epigastric pain after tapering off prilosec.  Spicy food kicked it off.  Ate spicy beans from [employer] 30 min prior to bedtime, then had episode.  Did have vomiting with this episode.  No blood in emesis.  Felt better after emesis.  Seen Dr. Algis Downs.  Thought due to gastritis/GERD.  Restarted prilosec 20 bid and noticed sxs improving.  Watching spicy foods.  No more problems.  For details of episode plz see Dr. Ashok Cordia note OV  No SOB, cough, diarrhea.  Recently took another part time job (stressful).  Doesn't think will be returning to part time job.  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  Never had endoscopy.    Noticed aspirin caused easy bleeding so stopped that.  Stopped B50 as well as Bell's palsy as improved as pt thinks will be.  Review of Systems Per HPI    Objective:   Physical Exam  Vitals reviewed. Constitutional: He appears well-developed and well-nourished. No distress.  HENT:  Head: Normocephalic and atraumatic.  Mouth/Throat: Oropharynx is clear and moist. No oropharyngeal exudate.  Eyes: Conjunctivae are normal. Pupils are equal, round, and reactive to light. No scleral icterus.  Neck: Normal range of motion. Neck supple.  Cardiovascular: Normal rate, regular rhythm, normal heart sounds and intact distal pulses.   No murmur heard. Pulmonary/Chest: Effort normal and breath sounds normal. No respiratory distress. He has no wheezes. He has no rales.  Abdominal: Soft. Bowel sounds are normal. He exhibits no distension. There is no tenderness. There is no rebound.       No epigastric tenderness, no abd/renal bruits  Skin: Skin is warm and dry. No rash noted.          Assessment & Plan:

## 2011-04-06 ENCOUNTER — Telehealth: Payer: Self-pay | Admitting: *Deleted

## 2011-04-06 NOTE — Telephone Encounter (Signed)
Advised pt.  He says he knows the pain isnt heart related, it just hurts so bad when it comes.

## 2011-04-06 NOTE — Telephone Encounter (Signed)
Pt has been having problems with severe reflux. He has appt to see you tomorrow but asks what he should do in the meantime.  He's taking 40 mg's of prilosec daily. He has occasional spells where it feels like he's having a heart attack.  Should he take an additional prilosec when he has these spells?  He's concerned that he will have a spell before he sees you tomorrow afternoon.

## 2011-04-06 NOTE — Telephone Encounter (Signed)
May take 40mg  prilosec twice daily until sees me. If feels like having heart attack, needs to be seen immediately - may be ER again.  Otherwise keep appointment with me tomorrow.

## 2011-04-07 ENCOUNTER — Encounter: Payer: Self-pay | Admitting: Family Medicine

## 2011-04-07 ENCOUNTER — Telehealth: Payer: Self-pay | Admitting: Family Medicine

## 2011-04-07 ENCOUNTER — Ambulatory Visit (INDEPENDENT_AMBULATORY_CARE_PROVIDER_SITE_OTHER): Payer: 59 | Admitting: Family Medicine

## 2011-04-07 VITALS — BP 122/84 | HR 88 | Temp 98.8°F | Wt 210.2 lb

## 2011-04-07 DIAGNOSIS — R1013 Epigastric pain: Secondary | ICD-10-CM

## 2011-04-07 LAB — COMPREHENSIVE METABOLIC PANEL
ALT: 25 U/L (ref 0–53)
AST: 22 U/L (ref 0–37)
Albumin: 4.4 g/dL (ref 3.5–5.2)
Alkaline Phosphatase: 91 U/L (ref 39–117)
BUN: 12 mg/dL (ref 6–23)
CO2: 28 mEq/L (ref 19–32)
Calcium: 10.7 mg/dL — ABNORMAL HIGH (ref 8.4–10.5)
Chloride: 97 mEq/L (ref 96–112)
Creat: 0.98 mg/dL (ref 0.50–1.35)
Glucose, Bld: 122 mg/dL — ABNORMAL HIGH (ref 70–99)
Potassium: 4.1 mEq/L (ref 3.5–5.3)
Sodium: 138 mEq/L (ref 135–145)
Total Protein: 7.7 g/dL (ref 6.0–8.3)

## 2011-04-07 LAB — CBC WITH DIFFERENTIAL/PLATELET
Basophils Absolute: 0 10*3/uL (ref 0.0–0.1)
Basophils Relative: 0 % (ref 0–1)
Eosinophils Absolute: 0 10*3/uL (ref 0.0–0.7)
Eosinophils Relative: 0 % (ref 0–5)
HCT: 50.4 % (ref 39.0–52.0)
Hemoglobin: 18.1 g/dL — ABNORMAL HIGH (ref 13.0–17.0)
Lymphocytes Relative: 13 % (ref 12–46)
Lymphs Abs: 1.9 10*3/uL (ref 0.7–4.0)
MCH: 30.5 pg (ref 26.0–34.0)
MCHC: 35.9 g/dL (ref 30.0–36.0)
MCV: 85 fL (ref 78.0–100.0)
Monocytes Absolute: 1.1 10*3/uL — ABNORMAL HIGH (ref 0.1–1.0)
Monocytes Relative: 8 % (ref 3–12)
Neutro Abs: 10.9 10*3/uL — ABNORMAL HIGH (ref 1.7–7.7)
Neutrophils Relative %: 78 % — ABNORMAL HIGH (ref 43–77)
Platelets: 188 10*3/uL (ref 150–400)
RBC: 5.93 MIL/uL — ABNORMAL HIGH (ref 4.22–5.81)
RDW: 12.3 % (ref 11.5–15.5)
WBC: 13.9 10*3/uL — ABNORMAL HIGH (ref 4.0–10.5)

## 2011-04-07 LAB — LIPASE: Lipase: 24 U/L (ref 11–59)

## 2011-04-07 MED ORDER — KETOROLAC TROMETHAMINE 60 MG/2ML IM SOLN
30.0000 mg | Freq: Once | INTRAMUSCULAR | Status: AC
  Administered 2011-04-07: 30 mg via INTRAMUSCULAR

## 2011-04-07 MED ORDER — METRONIDAZOLE 500 MG PO TABS: 500.0000 mg | ORAL_TABLET | Freq: Three times a day (TID) | ORAL | Status: AC

## 2011-04-07 MED ORDER — CIPROFLOXACIN HCL 500 MG PO TABS: 500.0000 mg | ORAL_TABLET | Freq: Two times a day (BID) | ORAL | Status: AC

## 2011-04-07 MED ORDER — OXYCODONE-ACETAMINOPHEN 5-325 MG PO TABS: 1.0000 | ORAL_TABLET | ORAL | Status: AC | PRN

## 2011-04-07 NOTE — Patient Instructions (Addendum)
EKG today - looking overall ok, nothing acute. Sounds like gallstones Blood work today to check on liver, pancreas, evidence for infection.  I will call you if any change between now and ultrasound. Call me with questions. We will set you up for ultrasound - call you tomorrow. Percocets for pain. If fever, or pain not controlled, please go to ER to be evaluated as gallstones can cause complications including infection.  Cholelithiasis Cholelithiasis (also called gallstones) is a form of gallbladder disease where gallstones form in your gallbladder. The gallbladder is a non-essential organ that stores bile made in the liver, which helps digest fats. Gallstones begin as small crystals and slowly grow into stones. Gallstone pain occurs when the gallbladder spasms, and a gallstone is blocking the duct. Pain can also occur when a stone passes out of the duct.  Women are more likely to develop gallstones than men. Other factors that increase the risk of gallbladder disease are:  Having multiple pregnancies. Physicians sometimes advise removing diseased gallbladders before future pregnancies.   Obesity.   Diets heavy in fried foods and fat.   Increasing age (older than 75).   Prolonged use of medications containing male hormones.   Diabetes mellitus.   Rapid weight loss.   Family history of gallstones (heredity).  SYMPTOMS  Feeling sick to your stomach (nauseous).   Abdominal pain.   Yellowing of the skin (jaundice).   Sudden pain. It may persist from several minutes to several hours.   Worsening pain with deep breathing or when jarred.   Fever.   Tenderness to the touch.  In some cases, when gallstones do not move into the bile duct, people have no pain or symptoms. These are called "silent" gallstones. TREATMENT In severe cases, emergency surgery may be required. HOME CARE INSTRUCTIONS   Only take over-the-counter or prescription medicines for pain, discomfort, or fever as  directed by your caregiver.   Follow a low-fat diet until seen again. Fat causes the gallbladder to contract, which can result in pain.   Follow up as instructed. Attacks are almost always recurrent and surgery is usually required for permanent treatment.  SEEK IMMEDIATE MEDICAL CARE IF:   Your pain increases and is not controlled by medications.   You have an oral temperature above 101 F (38.9 C), not controlled by medication.   You develop nausea and vomiting.  MAKE SURE YOU:   Understand these instructions.   Will watch your condition.   Will get help right away if you are not doing well or get worse.  Document Released: 06/02/2005 Document Revised: 02/16/2011 Document Reviewed: 08/05/2010 [facility] Patient Information 2012 [place], [place].

## 2011-04-07 NOTE — Progress Notes (Signed)
  Subjective:    Patient ID: Greg Wallace, male    DOB: 1960-12-14, 50 y.o.   MRN: [id number]  HPI CC: epigastric pain  Abdominal pain worsening recently.  Has had 6 episodes in last 4 months, 3 in last 1-2 weeks.  No nausea until pain becomes intolerable.  If and when vomits, pain dissipates.  Last 2 episodes have happened last 2 nights, unable to sleep.  Seems to have started after fried foods, then wakes up at 2am with dull epigastric pressure/ache, crescendo, travels occasionally to back.  Not significant indigestion, gassiness and bloating.  No fevers/chills, diaphoresis, arm or jaw pain, SOB.  Emesis yellow, clear fluid.  NBNB.  No jaundice.  Not exhertional, not relieved with rest.  Has doubled omeprazole 40mg  to bid, hasn't helped.  Has changed diet - stays away from fatty foods, caffeine, no whole milk, nothing fried recently.  Medications and allergies reviewed and updated in chart.  Past histories reviewed and updated if relevant as below. Patient Active Problem List  Diagnoses  . OBESITY  . BELL'S PALSY, RIGHT  . HYPERTENSION  . Abdominal pain, epigastric  . DYSLIPIDEMIA   Past Medical History  Diagnosis Date  . Hypertension   . Bell's palsy 03/2010    Right  . GERD (gastroesophageal reflux disease)    Past Surgical History  Procedure Date  . Finger laceration     Stitches  . Hand injury     Right palm cut with router, s/p CTS   History  Substance Use Topics  . Smoking status: Never Smoker   . Smokeless tobacco: Not on file  . Alcohol Use: No   Family History  Problem Relation Age of Onset  . Hypertension Mother   . Cancer Father     Lung (smoking)  . Diabetes Neg Hx   . Heart disease Neg Hx     CAD, MI  . Stroke Neg Hx    Allergies  Allergen Reactions  . Penicillins     REACTION: Hives   Current Outpatient Prescriptions on File Prior to Visit  Medication Sig Dispense Refill  . lisinopril-hydrochlorothiazide (PRINZIDE,ZESTORETIC) 10-12.5 MG per  tablet Take 1 tablet by mouth daily.        Marland Kitchen omeprazole (PRILOSEC) 40 MG capsule Take 1 capsule (40 mg total) by mouth daily.  90 capsule  3   Review of Systems Per HPI    Objective:   Physical Exam  Nursing note and vitals reviewed. Constitutional: He appears well-developed and well-nourished.  HENT:  Head: Normocephalic and atraumatic.  Mouth/Throat: Oropharynx is clear and moist. No oropharyngeal exudate.  Eyes: Conjunctivae and EOM are normal. Pupils are equal, round, and reactive to light. No scleral icterus.  Neck: Normal range of motion. Neck supple.  Cardiovascular: Normal rate, normal heart sounds and intact distal pulses.  An irregular rhythm present.  No murmur heard. Pulmonary/Chest: Effort normal and breath sounds normal. No respiratory distress. He has no wheezes. He has no rales.  Abdominal: Normal appearance and bowel sounds are normal. There is no hepatosplenomegaly. There is tenderness in the epigastric area. There is no rigidity, no rebound, no guarding, no tenderness at McBurney's point and negative Murphy's sign.  Skin: Skin is warm and dry. No rash noted.  Psychiatric: He has a normal mood and affect.      Assessment & Plan:

## 2011-04-07 NOTE — Assessment & Plan Note (Addendum)
Checked EKG in this 50yo hypertensive = NSR 82, no ST/T changes, nl axis, intervals. Story sounds consistent with gallstones although location not RUQ. Check stat blood work to eval for infection, pancreatitis given boring pain sxs. Obtain US. Shot of toradol today for pain.  Percocets for pain at home. Advised if fever, or pain not controlled, needs to go to ER for evaluation. Discussed complications of cholelithiasis.

## 2011-04-07 NOTE — Telephone Encounter (Signed)
Discussed blood work results.  Anticipate cholelithiasis with biliary colic Elevated white count.   I will start antibiotic for elevated white count. Advised to start tomorrow. Advised if worse pain, fever, to go to ER. Would also like to schedule surgery appt, will forward to [facility].

## 2011-04-08 ENCOUNTER — Telehealth: Payer: Self-pay | Admitting: *Deleted

## 2011-04-08 NOTE — Telephone Encounter (Signed)
Spoke with pt last night re this.

## 2011-04-08 NOTE — Telephone Encounter (Signed)
[facility] Triage Call Report Triage Record Num: [id number] Operator: Gypsy Decant Patient Name: Greg Wallace Call Date & Time: 04/07/2011 8:19:15PM Patient Phone: [phone] PCP: Eustaquio Boyden Patient Gender: Male PCP Fax : [phone] Patient DOB: Sep 26, 1960 Practice Name: [facility] Reason for Call: PCP is . Callback number is [phone]. [name] with [employer] calling regarding Lab results from labs drawn 04-07-11 at 1725. WBC is 13.9 (Range 4.0-10.5) , RBC 5.93 (Range 4.22-5.81) HGB 18.1 (Range 13.0-17.0) Granulocyte % 78 ( Range 43-77) Absolute Gram 10.9 (Range 1.7-7.7) Absolute Mono 1.1 ( Range 0.1-1.0) Glucose 122 ( Range 70-99) Calcium 10.7 ( Range 8.4-10.5 ) Priors unknown. Call to Patient to to assess patient. Patient states that he is having no pain at this time. Has had several spells of Stomach pain that doctor suspects may be gallbladder. Patient denies, pain discomfort,n/v, diarrhea, Afebrile. 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 Pt verbalized understanding. Call to on-call provider Oliver Barre, MD and advised of above results. No new orders received. Protocol(s) Used: PCP Calls, No Triage (Adult) Recommended Outcome per Protocol: Call Provider within 24 Hours Reason for Outcome: Lab calling with test results Care Advice: ~ 04/07/2011 8:43:48PM Page 1 of 1 [id number]

## 2011-04-12 ENCOUNTER — Ambulatory Visit
Admission: RE | Admit: 2011-04-12 | Discharge: 2011-04-12 | Disposition: A | Discharge status: Home / Self Care | Payer: 59 | Source: Ambulatory Visit | Attending: Family Medicine | Admitting: Family Medicine

## 2011-04-12 ENCOUNTER — Telehealth: Payer: Self-pay | Admitting: Family Medicine

## 2011-04-12 DIAGNOSIS — R1013 Epigastric pain: Secondary | ICD-10-CM

## 2011-04-12 NOTE — Telephone Encounter (Signed)
Patient notified. He will await call from [facility] for surgical referral. He wants to go to someone in [place]. He did say that it will financially impossible for him to have surgery any time soon due to his job. He was hired as a temp and if he is out for any extended period of time before next July he will lose his job. He doesn't qualify for FMLA or STD until then, so he wants to postpone surgery as long as possible. I told him that would be between him and the surgeon. I advised that leaving the stones untreated for a prolonged time frame, could cause other problems down the road and it could cause the removal to be under emergent circumstances which is more risky for him. He verbalized understanding and said he would speak with the surgeon and go from there.

## 2011-04-12 NOTE — Telephone Encounter (Signed)
Please notify ultrasound returned showing multiple large gallstones.  I do want to set him up with surgery referral. Have routed to [facility] as well to schedule surg eval (order previously in chart).

## 2011-04-12 NOTE — Telephone Encounter (Signed)
Message left for patient to return my call.  

## 2011-04-13 NOTE — Telephone Encounter (Signed)
Noted.  Will set him up with surgery.  Needs surgery.

## 2011-04-22 ENCOUNTER — Ambulatory Visit (INDEPENDENT_AMBULATORY_CARE_PROVIDER_SITE_OTHER): Payer: 59 | Admitting: General Surgery

## 2011-04-22 VITALS — BP 134/86 | HR 72 | Temp 98.0°F | Resp 16 | Ht 70.0 in | Wt 210.4 lb

## 2011-04-22 DIAGNOSIS — K8020 Calculus of gallbladder without cholecystitis without obstruction: Secondary | ICD-10-CM

## 2011-04-22 DIAGNOSIS — R1013 Epigastric pain: Secondary | ICD-10-CM

## 2011-04-22 NOTE — Progress Notes (Signed)
Subjective:   Abdominal pain  Patient ID: Greg Wallace, male   DOB: 11-Oct-1960, 50 y.o.   MRN: [id number]  HPI Patient is a 50 year old male referred for repeated episodes of epigastric pain and new diagnosis of cholelithiasis. The patient states that he has been having episodes of pain for about one year. This was initially treated as possibly reflux. However in recent months he has had more frequent and severe episodes. He describes the onset of a pressure-like pain in his epigastrium and radiates around both flanks to his back. This will last for several hours and will be relieved sometimes by vomiting or he will go to sleep and it will be gone. He recently has found that fatty foods precipitate the attacks and he has avoided them by avoiding greasy or fatty foods. He has not had any fever chills or jaundice. He recently saw his PCP and an ultrasound of the gallbladder and abdomen was obtained which are reviewed. This shows multiple mobile gallstones and normal common bile duct. The patient has been entirely asymptomatic between episodes. No change in bowel habits or urinary symptoms.  Past Medical History  Diagnosis Date  . Hypertension   . Bell's palsy 03/2010    Right  . GERD (gastroesophageal reflux disease)    Past Surgical History  Procedure Date  . Finger laceration     Stitches  . Hand injury     Right palm cut with router, s/p CTS   Current Outpatient Prescriptions  Medication Sig Dispense Refill  . lisinopril-hydrochlorothiazide (PRINZIDE,ZESTORETIC) 10-12.5 MG per tablet Take 1 tablet by mouth daily.        Marland Kitchen omeprazole (PRILOSEC) 40 MG capsule Take 1 capsule (40 mg total) by mouth daily.  90 capsule  3   Allergies  Allergen Reactions  . Penicillins     REACTION: Hives   History  Substance Use Topics  . Smoking status: Never Smoker   . Smokeless tobacco: Not on file  . Alcohol Use: No     Review of Systems  Constitutional: Negative.   HENT: Negative.     Respiratory: Negative.   Cardiovascular: Negative.   Gastrointestinal: Positive for nausea and abdominal pain. Negative for diarrhea, constipation and blood in stool.  Genitourinary: Negative.   Hematological: Negative.        Objective:   Physical Exam Gen.: Mildly overweight otherwise healthy-appearing Caucasian male Skin: Warm and dry without rash or infection HEENT: No palpable masses or thyromegaly. Sclera nonicteric. Pupils equal and reactive. Lymph nodes: No palpable cervical supraclavicular or inguinal nodes Lungs: Breath sounds clear and equal without increased work of breathing Cardiovascular: Regular rate and rhythm without murmur. No JVD or edema. Peripheral pulses intact. Abdomen: Nondistended. Soft and nontender. No palpable masses or organomegaly. No hernias detected. Extremities: No joint swelling or deformity Neurologic: Alert and fully oriented. Gait normal.  Labs: Recent lab work showed normal liver function tests and mildly elevated white count at 13,000    Assessment:     50 year old male with repeated episodes of typical biliary colic and gallstones documented on ultrasound. I recommended proceeding with laparoscopic cholecystectomy to relieve his symptoms and prevent complications from his gallstones. We discussed the nature of the procedure, its indications, recovery, and risks of bleeding, infection, anesthetic complications, bile leak, and bile duct or visceral injury. They were given complete literature regarding the procedure. We'll schedule this later in the month at his convenience.    Plan:     Laparoscopic cholecystectomy with cholangiogram  under general anesthesia as an outpatient.

## 2011-04-22 NOTE — Patient Instructions (Signed)

## 2011-04-25 ENCOUNTER — Other Ambulatory Visit (INDEPENDENT_AMBULATORY_CARE_PROVIDER_SITE_OTHER): Payer: Self-pay | Admitting: General Surgery

## 2011-04-26 ENCOUNTER — Other Ambulatory Visit (INDEPENDENT_AMBULATORY_CARE_PROVIDER_SITE_OTHER): Payer: Self-pay | Admitting: General Surgery

## 2011-04-26 NOTE — H&P (Signed)
 Greg Wallace   04/22/2011 1:30 PM Office Visit  MRN: [id number]   Description: 50 year old male  Provider: Joetta Delprado T, MD  Department: [facility]        Diagnoses     Cholelithiases   - Primary    574.20    Abdominal pain, epigastric     789.06      Reason for Visit     New Evaluation    eval of biliary cholic and abdominal pain, GB with stones on US        Vitals - Last Recorded       BP Pulse Temp(Src) Resp Ht Wt    134/86  72  98 F (36.7 C) (Temporal)  16  5' 10" (1.778 m)  210 lb 6 oz (95.425 kg)          BMI              30.19 kg/m2                 Progress Notes     Shakiera Edelson T, MD  04/22/2011  2:13 PM  SignedSubjective:     Abdominal pain Patient ID: Greg Wallace, male   DOB: 08/07/1960, 50 y.o.   MRN: [id number]   HPI Patient is a 50-year-old male referred for repeated episodes of epigastric pain and new diagnosis of cholelithiasis. The patient states that he has been having episodes of pain for about one year. This was initially treated as possibly reflux. However in recent months he has had more frequent and severe episodes. He describes the onset of a pressure-like pain in his epigastrium and radiates around both flanks to his back. This will last for several hours and will be relieved sometimes by vomiting or he will go to sleep and it will be gone. He recently has found that fatty foods precipitate the attacks and he has avoided them by avoiding greasy or fatty foods. He has not had any fever chills or jaundice. He recently saw his PCP and an ultrasound of the gallbladder and abdomen was obtained which are reviewed. This shows multiple mobile gallstones and normal common bile duct. The patient has been entirely asymptomatic between episodes. No change in bowel habits or urinary symptoms.    Past Medical History   Diagnosis  Date   .  Hypertension     .  Bell's palsy  03/2010       Right   .  GERD (gastroesophageal reflux  disease)      Past Surgical History   Procedure  Date   .  Finger laceration         Stitches   .  Hand injury         Right palm cut with router, s/p CTS    Current Outpatient Prescriptions   Medication  Sig  Dispense  Refill   .  lisinopril-hydrochlorothiazide (PRINZIDE,ZESTORETIC) 10-12.5 MG per tablet  Take 1 tablet by mouth daily.           .  omeprazole (PRILOSEC) 40 MG capsule  Take 1 capsule (40 mg total) by mouth daily.   90 capsule   3    Allergies   Allergen  Reactions   .  Penicillins         REACTION: Hives    History   Substance Use Topics   .  Smoking status:  Never Smoker    .  Smokeless tobacco:  Not on file   .    Alcohol Use:  No        Review of Systems  Constitutional: Negative.   HENT: Negative.   Respiratory: Negative.   Cardiovascular: Negative.   Gastrointestinal: Positive for nausea and abdominal pain. Negative for diarrhea, constipation and blood in stool.  Genitourinary: Negative.   Hematological: Negative.       Objective:    Physical Exam Gen.: Mildly overweight otherwise healthy-appearing Caucasian male Skin: Warm and dry without rash or infection HEENT: No palpable masses or thyromegaly. Sclera nonicteric. Pupils equal and reactive. Lymph nodes: No palpable cervical supraclavicular or inguinal nodes Lungs: Breath sounds clear and equal without increased work of breathing Cardiovascular: Regular rate and rhythm without murmur. No JVD or edema. Peripheral pulses intact. Abdomen: Nondistended. Soft and nontender. No palpable masses or organomegaly. No hernias detected. Extremities: No joint swelling or deformity Neurologic: Alert and fully oriented. Gait normal.   Labs: Recent lab work showed normal liver function tests and mildly elevated white count at 13,000   Assessment:      50-year-old male with repeated episodes of typical biliary colic and gallstones documented on ultrasound. I recommended proceeding with laparoscopic  cholecystectomy to relieve his symptoms and prevent complications from his gallstones. We discussed the nature of the procedure, its indications, recovery, and risks of bleeding, infection, anesthetic complications, bile leak, and bile duct or visceral injury. They were given complete literature regarding the procedure. We'll schedule this later in the month at his convenience.   Plan:      Laparoscopic cholecystectomy with cholangiogram under general anesthesia as an outpatient.                Not recorded       Discontinued Medications         Reason for Discontinue    omeprazole (PRILOSEC) 40 MG capsule Error    ciprofloxacin (CIPRO) 500 MG tablet Error    metroNIDAZOLE (FLAGYL) 500 MG tablet Error           Patient Instructions     Laparoscopic Cholecystectomy  Laparoscopic cholecystectomy is surgery to remove the gallbladder. The gallbladder is located slightly to the right of center in the abdomen, behind the liver. It is a concentrating and storage sac for the bile produced in the liver. Bile aids in the digestion and absorption of fats. Gallbladder disease (cholecystitis) is an inflammation of your gallbladder. This condition is usually caused by a buildup of gallstones (cholelithiasis) in your gallbladder. Gallstones can block the flow of bile, resulting in inflammation and pain. In severe cases, emergency surgery may be required. When emergency surgery is not required, you will have time to prepare for the procedure. Laparoscopic surgery is an alternative to open surgery. Laparoscopic surgery usually has a shorter recovery time. Your common bile duct may also need to be examined and explored. Your caregiver will discuss this with you if he or she feels this should be done. If stones are found in the common bile duct, they may be removed. LET YOUR CAREGIVER KNOW ABOUT: Allergies to food or medicine.   Medicines taken, including vitamins, herbs, eyedrops,  over-the-counter medicines, and creams.   Use of steroids (by mouth or creams).   Previous problems with anesthetics or numbing medicines.   History of bleeding problems or blood clots.   Previous surgery.   Other health problems, including diabetes and kidney problems.   Possibility of pregnancy, if this applies.  RISKS AND COMPLICATIONS  All surgery is associated with risks. Some problems that   may occur following this procedure include: Infection.   Damage to the common bile duct, nerves, arteries, veins, or other internal organs such as the stomach or intestines.   Bleeding.   A stone may remain in the common bile duct.  BEFORE THE PROCEDURE Do not take aspirin for 3 days prior to surgery or blood thinners for 1 week prior to surgery.   Do not eat or drink anything after midnight the night before surgery.   Let your caregiver know if you develop a cold or other infectious problem prior to surgery.   You should be present 60 minutes before the procedure or as directed.  PROCEDURE   You will be given medicine that makes you sleep (general anesthetic). When you are asleep, your surgeon will make several small cuts (incisions) in your abdomen. One of these incisions is used to insert a small, lighted scope (laparoscope) into the abdomen. The laparoscope helps the surgeon see into your abdomen. Carbon dioxide gas will be pumped into your abdomen. The gas allows more room for the surgeon to perform your surgery. Other operating instruments are inserted through the other incisions. Laparoscopic procedures may not be appropriate when: There is major scarring from previous surgery.   The gallbladder is extremely inflamed.   There are bleeding disorders or unexpected cirrhosis of the liver.   A pregnancy is near term.   Other conditions make the laparoscopic procedure impossible.  If your surgeon feels it is not safe to continue with a laparoscopic procedure, he or she will perform an open  abdominal procedure. In this case, the surgeon will make an incision to open the abdomen. This gives the surgeon a larger view and field to work within. This may allow the surgeon to perform procedures that sometimes cannot be performed with a laparoscope alone. Open surgery has a longer recovery time. AFTER THE PROCEDURE You will be taken to the recovery area where a nurse will watch and check your progress.   You may be allowed to go home the same day.   Do not resume physical activities until directed by your caregiver.   You may resume a normal diet and activities as directed.  Document Released: 06/06/2005 Document Revised: 02/16/2011 Document Reviewed: 11/19/2010 [employer] Patient Information 2012 [employer].       Level of Service     PR OFFICE CONSULTATION,LEVEL III [99243]      Follow-up and Disposition     Return After surgery.        All Flowsheet Templates (all recorded)     Encounter Vitals Flowsheet    Custom Formula Data Flowsheet    Anthropometrics Flowsheet               Referring Provider          Javier Gutierrez, MD       All Charges for This Encounter       Code Description Service Date Service Provider Modifiers Quantity    99243 PR OFFICE CONSULTATION,LEVEL III 04/22/2011 Meliyah Simon T Alexander Mcauley, MD   1        Other Encounter Related Information     Allergies & Medications         Problem List         History         Patient-Entered Questionnaires            Greg Wallace   04/22/2011 1:30 PM Office Visit  MRN: [id number]   Description: 50 year   old male  Provider: Mckenleigh Tarlton T, MD  Department: [facility]        Diagnoses     Cholelithiases   - Primary    574.20    Abdominal pain, epigastric     789.06      Reason for Visit     New Evaluation    eval of biliary cholic and abdominal pain, GB with stones on US        Vitals - Last Recorded       BP Pulse Temp(Src) Resp Ht Wt    134/86  72  98 F (36.7  C) (Temporal)  16  5' 10" (1.778 m)  210 lb 6 oz (95.425 kg)          BMI              30.19 kg/m2                 Progress Notes     Lilyanne Mcquown T, MD  04/22/2011  2:13 PM  SignedSubjective:     Abdominal pain Patient ID: Greg Wallace, male   DOB: 07/18/1960, 50 y.o.   MRN: [id number]   HPI Patient is a 50-year-old male referred for repeated episodes of epigastric pain and new diagnosis of cholelithiasis. The patient states that he has been having episodes of pain for about one year. This was initially treated as possibly reflux. However in recent months he has had more frequent and severe episodes. He describes the onset of a pressure-like pain in his epigastrium and radiates around both flanks to his back. This will last for several hours and will be relieved sometimes by vomiting or he will go to sleep and it will be gone. He recently has found that fatty foods precipitate the attacks and he has avoided them by avoiding greasy or fatty foods. He has not had any fever chills or jaundice. He recently saw his PCP and an ultrasound of the gallbladder and abdomen was obtained which are reviewed. This shows multiple mobile gallstones and normal common bile duct. The patient has been entirely asymptomatic between episodes. No change in bowel habits or urinary symptoms.    Past Medical History   Diagnosis  Date   .  Hypertension     .  Bell's palsy  03/2010       Right   .  GERD (gastroesophageal reflux disease)      Past Surgical History   Procedure  Date   .  Finger laceration         Stitches   .  Hand injury         Right palm cut with router, s/p CTS    Current Outpatient Prescriptions   Medication  Sig  Dispense  Refill   .  lisinopril-hydrochlorothiazide (PRINZIDE,ZESTORETIC) 10-12.5 MG per tablet  Take 1 tablet by mouth daily.           .  omeprazole (PRILOSEC) 40 MG capsule  Take 1 capsule (40 mg total) by mouth daily.   90 capsule   3    Allergies   Allergen   Reactions   .  Penicillins         REACTION: Hives    History   Substance Use Topics   .  Smoking status:  Never Smoker    .  Smokeless tobacco:  Not on file   .  Alcohol Use:  No        Review of Systems    Constitutional: Negative.   HENT: Negative.   Respiratory: Negative.   Cardiovascular: Negative.   Gastrointestinal: Positive for nausea and abdominal pain. Negative for diarrhea, constipation and blood in stool.  Genitourinary: Negative.   Hematological: Negative.       Objective:    Physical Exam Gen.: Mildly overweight otherwise healthy-appearing Caucasian male Skin: Warm and dry without rash or infection HEENT: No palpable masses or thyromegaly. Sclera nonicteric. Pupils equal and reactive. Lymph nodes: No palpable cervical supraclavicular or inguinal nodes Lungs: Breath sounds clear and equal without increased work of breathing Cardiovascular: Regular rate and rhythm without murmur. No JVD or edema. Peripheral pulses intact. Abdomen: Nondistended. Soft and nontender. No palpable masses or organomegaly. No hernias detected. Extremities: No joint swelling or deformity Neurologic: Alert and fully oriented. Gait normal.   Labs: Recent lab work showed normal liver function tests and mildly elevated white count at 13,000   Assessment:      50-year-old male with repeated episodes of typical biliary colic and gallstones documented on ultrasound. I recommended proceeding with laparoscopic cholecystectomy to relieve his symptoms and prevent complications from his gallstones. We discussed the nature of the procedure, its indications, recovery, and risks of bleeding, infection, anesthetic complications, bile leak, and bile duct or visceral injury. They were given complete literature regarding the procedure. We'll schedule this later in the month at his convenience.   Plan:      Laparoscopic cholecystectomy with cholangiogram under general anesthesia as an outpatient.                 Not recorded       Discontinued Medications         Reason for Discontinue    omeprazole (PRILOSEC) 40 MG capsule Error    ciprofloxacin (CIPRO) 500 MG tablet Error    metroNIDAZOLE (FLAGYL) 500 MG tablet Error           Patient Instructions     Laparoscopic Cholecystectomy  Laparoscopic cholecystectomy is surgery to remove the gallbladder. The gallbladder is located slightly to the right of center in the abdomen, behind the liver. It is a concentrating and storage sac for the bile produced in the liver. Bile aids in the digestion and absorption of fats. Gallbladder disease (cholecystitis) is an inflammation of your gallbladder. This condition is usually caused by a buildup of gallstones (cholelithiasis) in your gallbladder. Gallstones can block the flow of bile, resulting in inflammation and pain. In severe cases, emergency surgery may be required. When emergency surgery is not required, you will have time to prepare for the procedure. Laparoscopic surgery is an alternative to open surgery. Laparoscopic surgery usually has a shorter recovery time. Your common bile duct may also need to be examined and explored. Your caregiver will discuss this with you if he or she feels this should be done. If stones are found in the common bile duct, they may be removed. LET YOUR CAREGIVER KNOW ABOUT: Allergies to food or medicine.   Medicines taken, including vitamins, herbs, eyedrops, over-the-counter medicines, and creams.   Use of steroids (by mouth or creams).   Previous problems with anesthetics or numbing medicines.   History of bleeding problems or blood clots.   Previous surgery.   Other health problems, including diabetes and kidney problems.   Possibility of pregnancy, if this applies.  RISKS AND COMPLICATIONS  All surgery is associated with risks. Some problems that may occur following this procedure include: Infection.   Damage to the common bile duct, nerves,    arteries, veins, or other internal organs such as the stomach or intestines.   Bleeding.   A stone may remain in the common bile duct.  BEFORE THE PROCEDURE Do not take aspirin for 3 days prior to surgery or blood thinners for 1 week prior to surgery.   Do not eat or drink anything after midnight the night before surgery.   Let your caregiver know if you develop a cold or other infectious problem prior to surgery.   You should be present 60 minutes before the procedure or as directed.  PROCEDURE   You will be given medicine that makes you sleep (general anesthetic). When you are asleep, your surgeon will make several small cuts (incisions) in your abdomen. One of these incisions is used to insert a small, lighted scope (laparoscope) into the abdomen. The laparoscope helps the surgeon see into your abdomen. Carbon dioxide gas will be pumped into your abdomen. The gas allows more room for the surgeon to perform your surgery. Other operating instruments are inserted through the other incisions. Laparoscopic procedures may not be appropriate when: There is major scarring from previous surgery.   The gallbladder is extremely inflamed.   There are bleeding disorders or unexpected cirrhosis of the liver.   A pregnancy is near term.   Other conditions make the laparoscopic procedure impossible.  If your surgeon feels it is not safe to continue with a laparoscopic procedure, he or she will perform an open abdominal procedure. In this case, the surgeon will make an incision to open the abdomen. This gives the surgeon a larger view and field to work within. This may allow the surgeon to perform procedures that sometimes cannot be performed with a laparoscope alone. Open surgery has a longer recovery time. AFTER THE PROCEDURE You will be taken to the recovery area where a nurse will watch and check your progress.   You may be allowed to go home the same day.   Do not resume physical activities until directed  by your caregiver.   You may resume a normal diet and activities as directed.  Document Released: 06/06/2005 Document Revised: 02/16/2011 Document Reviewed: 11/19/2010 [employer] Patient Information 2012 [employer].       Level of Service     PR OFFICE CONSULTATION,LEVEL III [99243]      Follow-up and Disposition     Return After surgery.        All Flowsheet Templates (all recorded)     Encounter Vitals Flowsheet    Custom Formula Data Flowsheet    Anthropometrics Flowsheet               Referring Provider          Javier Gutierrez, MD         

## 2011-05-10 ENCOUNTER — Other Ambulatory Visit: Payer: Self-pay | Admitting: Family Medicine

## 2011-05-10 DIAGNOSIS — E785 Hyperlipidemia, unspecified: Secondary | ICD-10-CM

## 2011-05-10 DIAGNOSIS — I10 Essential (primary) hypertension: Secondary | ICD-10-CM

## 2011-05-16 ENCOUNTER — Other Ambulatory Visit: Payer: 59

## 2011-05-16 ENCOUNTER — Encounter (HOSPITAL_COMMUNITY): Payer: Self-pay | Admitting: Pharmacy Technician

## 2011-05-17 ENCOUNTER — Ambulatory Visit (HOSPITAL_COMMUNITY)
Admission: RE | Admit: 2011-05-17 | Discharge: 2011-05-17 | Disposition: A | Discharge status: Home / Self Care | Payer: 59 | Source: Ambulatory Visit | Attending: General Surgery | Admitting: General Surgery

## 2011-05-17 ENCOUNTER — Encounter (HOSPITAL_COMMUNITY)
Admission: RE | Admit: 2011-05-17 | Discharge: 2011-05-17 | Disposition: A | Discharge status: Home / Self Care | Payer: 59 | Source: Ambulatory Visit | Attending: General Surgery | Admitting: General Surgery

## 2011-05-17 ENCOUNTER — Encounter (HOSPITAL_COMMUNITY): Payer: Self-pay

## 2011-05-17 DIAGNOSIS — K8020 Calculus of gallbladder without cholecystitis without obstruction: Secondary | ICD-10-CM | POA: Insufficient documentation

## 2011-05-17 DIAGNOSIS — Z01812 Encounter for preprocedural laboratory examination: Secondary | ICD-10-CM | POA: Insufficient documentation

## 2011-05-17 DIAGNOSIS — I10 Essential (primary) hypertension: Secondary | ICD-10-CM | POA: Insufficient documentation

## 2011-05-17 DIAGNOSIS — Z01818 Encounter for other preprocedural examination: Secondary | ICD-10-CM | POA: Insufficient documentation

## 2011-05-17 HISTORY — DX: Anxiety disorder, unspecified: F41.9

## 2011-05-17 LAB — CBC
Hemoglobin: 16.8 g/dL (ref 13.0–17.0)
MCH: 30.5 pg (ref 26.0–34.0)
RBC: 5.51 MIL/uL (ref 4.22–5.81)

## 2011-05-17 LAB — BASIC METABOLIC PANEL
CO2: 28 mEq/L (ref 19–32)
Calcium: 10.2 mg/dL (ref 8.4–10.5)
Glucose, Bld: 97 mg/dL (ref 70–99)
Potassium: 4.2 mEq/L (ref 3.5–5.1)
Sodium: 138 mEq/L (ref 135–145)

## 2011-05-17 LAB — SURGICAL PCR SCREEN: Staphylococcus aureus: NEGATIVE

## 2011-05-17 NOTE — Pre-Procedure Instructions (Signed)
Instructed no asa or blood thinners this week for pain as per order

## 2011-05-17 NOTE — Patient Instructions (Signed)
20 EUDELL MCPHEE  05/17/2011   Your procedure is scheduled on:  05/20/11   Friday   [id number]  Report to [facility] at 0845 AM.  Call this number if you have problems the morning of surgery: [phone]   Remember:   Do not eat food:After Midnight. Thursday night  May have clear liquids:until Midnight .Thursday  night  Clear liquids include soda, tea, black coffee, [place] or grape juice, broth.  Take these medicines the morning of surgery with A SIP OF WATER: oxycodone with sip water if needed   Do not wear jewelry, make-up or nail polish.  Do not wear lotions, powders, or perfumes. You may wear deodorant.  Do not shave 48 hours prior to surgery.  Do not bring valuables to the hospital.  Contacts, dentures or bridgework may not be worn into surgery.  Leave suitcase in the car. After surgery it may be brought to your room.  For patients admitted to the hospital, checkout time is 11:00 AM the day of discharge.   Patients discharged the day of surgery will not be allowed to drive home.  Name and phone number of your driver: wife  Special Instructions: CHG Shower Use Special Wash: 1/2 bottle night before surgery and 1/2 bottle morning of surgery. Regular soap face and privates   Please read over the following fact sheets that you were given: MRSA Information

## 2011-05-17 NOTE — Pre-Procedure Instructions (Signed)
EKG from 10/12 in EPIC

## 2011-05-18 NOTE — Pre-Procedure Instructions (Signed)
At PST visit 05/17/11 expressed extreme unhappiness over having to come for PST appt- states was not told about this until this RN made the appt last week. States he works and can get here before 2 10. He states he did meet with a schedular at surgeons office, but "she was in a hurry to leave and didn't mention this"  Stated labs HAD to be drawn at [facility] because his wife works there. I explained to him he would have to have a MD order and would need to fax results to PST by Thursday at noon.  I then talked to wife after patient called her- explained this to her. After 45 min, pt decided to have labs here but this is "NOT RIGHT"- dont have any choice. Every effort made to accomodate and please patient

## 2011-05-20 ENCOUNTER — Encounter (HOSPITAL_COMMUNITY)
Admission: RE | Disposition: A | Discharge status: Home / Self Care | Payer: Self-pay | Source: Ambulatory Visit | Attending: General Surgery

## 2011-05-20 ENCOUNTER — Encounter (HOSPITAL_COMMUNITY): Payer: Self-pay | Admitting: Anesthesiology

## 2011-05-20 ENCOUNTER — Ambulatory Visit (HOSPITAL_COMMUNITY): Payer: 59

## 2011-05-20 ENCOUNTER — Other Ambulatory Visit (INDEPENDENT_AMBULATORY_CARE_PROVIDER_SITE_OTHER): Payer: Self-pay | Admitting: General Surgery

## 2011-05-20 ENCOUNTER — Encounter (HOSPITAL_COMMUNITY): Payer: Self-pay | Admitting: *Deleted

## 2011-05-20 ENCOUNTER — Ambulatory Visit (HOSPITAL_COMMUNITY): Payer: 59 | Admitting: Anesthesiology

## 2011-05-20 ENCOUNTER — Ambulatory Visit (HOSPITAL_COMMUNITY)
Admission: RE | Admit: 2011-05-20 | Discharge: 2011-05-20 | Disposition: A | Discharge status: Home / Self Care | Payer: 59 | Source: Ambulatory Visit | Attending: General Surgery | Admitting: General Surgery

## 2011-05-20 DIAGNOSIS — K8010 Calculus of gallbladder with chronic cholecystitis without obstruction: Secondary | ICD-10-CM | POA: Insufficient documentation

## 2011-05-20 DIAGNOSIS — R1013 Epigastric pain: Secondary | ICD-10-CM | POA: Insufficient documentation

## 2011-05-20 HISTORY — PX: CHOLECYSTECTOMY: SHX55

## 2011-05-20 SURGERY — LAPAROSCOPIC CHOLECYSTECTOMY WITH INTRAOPERATIVE CHOLANGIOGRAM
Anesthesia: General | Site: Abdomen | Wound class: Clean Contaminated

## 2011-05-20 MED ORDER — IOHEXOL 300 MG/ML  SOLN
INTRAMUSCULAR | Status: AC
  Filled 2011-05-20: qty 1

## 2011-05-20 MED ORDER — LACTATED RINGERS IV SOLN: INTRAVENOUS | Status: DC

## 2011-05-20 MED ORDER — BUPIVACAINE-EPINEPHRINE PF 0.25-1:200000 % IJ SOLN
INTRAMUSCULAR | Status: AC
  Filled 2011-05-20: qty 30

## 2011-05-20 MED ORDER — OXYCODONE-ACETAMINOPHEN 5-325 MG PO TABS: 1.0000 | ORAL_TABLET | ORAL | Status: DC | PRN

## 2011-05-20 MED ORDER — FENTANYL CITRATE 0.05 MG/ML IJ SOLN
INTRAMUSCULAR | Status: AC
  Administered 2011-05-20: 50 ug via INTRAVENOUS
  Filled 2011-05-20: qty 2

## 2011-05-20 MED ORDER — ACETAMINOPHEN 10 MG/ML IV SOLN
INTRAVENOUS | Status: AC
  Filled 2011-05-20: qty 100

## 2011-05-20 MED ORDER — LACTATED RINGERS IV SOLN
INTRAVENOUS | Status: DC
  Administered 2011-05-20: 1000 mL via INTRAVENOUS

## 2011-05-20 MED ORDER — CIPROFLOXACIN IN D5W 400 MG/200ML IV SOLN: 400.0000 mg | INTRAVENOUS | Status: DC

## 2011-05-20 MED ORDER — SUCCINYLCHOLINE CHLORIDE 20 MG/ML IJ SOLN
INTRAMUSCULAR | Status: DC | PRN
  Administered 2011-05-20: 100 mg via INTRAVENOUS

## 2011-05-20 MED ORDER — LACTATED RINGERS IR SOLN
Status: DC | PRN
  Administered 2011-05-20: 1000 mL

## 2011-05-20 MED ORDER — PROPOFOL 10 MG/ML IV EMUL
INTRAVENOUS | Status: DC | PRN
  Administered 2011-05-20: 20 mL via INTRAVENOUS

## 2011-05-20 MED ORDER — IOHEXOL 300 MG/ML  SOLN
INTRAMUSCULAR | Status: DC | PRN
  Administered 2011-05-20: 5 mL

## 2011-05-20 MED ORDER — FENTANYL CITRATE 0.05 MG/ML IJ SOLN
INTRAMUSCULAR | Status: DC | PRN
  Administered 2011-05-20 (×2): 50 ug via INTRAVENOUS
  Administered 2011-05-20: 100 ug via INTRAVENOUS
  Administered 2011-05-20 (×2): 50 ug via INTRAVENOUS

## 2011-05-20 MED ORDER — BUPIVACAINE-EPINEPHRINE 0.25% -1:200000 IJ SOLN
INTRAMUSCULAR | Status: DC | PRN
  Administered 2011-05-20: 30 mL

## 2011-05-20 MED ORDER — PROMETHAZINE HCL 25 MG/ML IJ SOLN
INTRAMUSCULAR | Status: AC
  Filled 2011-05-20: qty 1

## 2011-05-20 MED ORDER — NEOSTIGMINE METHYLSULFATE 1 MG/ML IJ SOLN
INTRAMUSCULAR | Status: DC | PRN
  Administered 2011-05-20: 5 mg via INTRAVENOUS

## 2011-05-20 MED ORDER — OXYCODONE-ACETAMINOPHEN 5-325 MG PO TABS
ORAL_TABLET | ORAL | Status: AC
  Administered 2011-05-20: 1
  Filled 2011-05-20: qty 1

## 2011-05-20 MED ORDER — LABETALOL HCL 5 MG/ML IV SOLN
INTRAVENOUS | Status: DC | PRN
  Administered 2011-05-20: 5 mg via INTRAVENOUS
  Administered 2011-05-20: 2.5 mg via INTRAVENOUS

## 2011-05-20 MED ORDER — CIPROFLOXACIN IN D5W 400 MG/200ML IV SOLN
INTRAVENOUS | Status: DC | PRN
  Administered 2011-05-20: 400 mg via INTRAVENOUS

## 2011-05-20 MED ORDER — PROMETHAZINE HCL 25 MG/ML IJ SOLN
6.2500 mg | INTRAMUSCULAR | Status: DC | PRN
  Administered 2011-05-20: 6.25 mg via INTRAVENOUS

## 2011-05-20 MED ORDER — LIDOCAINE HCL (CARDIAC) 20 MG/ML IV SOLN
INTRAVENOUS | Status: DC | PRN
  Administered 2011-05-20: 100 mg via INTRAVENOUS

## 2011-05-20 MED ORDER — MIDAZOLAM HCL 5 MG/5ML IJ SOLN
INTRAMUSCULAR | Status: DC | PRN
  Administered 2011-05-20: 2 mg via INTRAVENOUS

## 2011-05-20 MED ORDER — GLYCOPYRROLATE 0.2 MG/ML IJ SOLN
INTRAMUSCULAR | Status: DC | PRN
  Administered 2011-05-20: .8 mg via INTRAVENOUS

## 2011-05-20 MED ORDER — CIPROFLOXACIN IN D5W 400 MG/200ML IV SOLN
INTRAVENOUS | Status: AC
  Filled 2011-05-20: qty 200

## 2011-05-20 MED ORDER — PROMETHAZINE HCL 12.5 MG PO TABS: 12.5000 mg | ORAL_TABLET | Freq: Four times a day (QID) | ORAL | Status: AC | PRN

## 2011-05-20 MED ORDER — ACETAMINOPHEN 10 MG/ML IV SOLN
INTRAVENOUS | Status: DC | PRN
  Administered 2011-05-20: 1000 mg via INTRAVENOUS

## 2011-05-20 MED ORDER — FENTANYL CITRATE 0.05 MG/ML IJ SOLN
50.0000 ug | INTRAMUSCULAR | Status: DC | PRN
  Administered 2011-05-20 (×2): 50 ug via INTRAVENOUS

## 2011-05-20 MED ORDER — ROCURONIUM BROMIDE 100 MG/10ML IV SOLN
INTRAVENOUS | Status: DC | PRN
  Administered 2011-05-20: 30 mg via INTRAVENOUS

## 2011-05-20 MED ORDER — ONDANSETRON HCL 4 MG/2ML IJ SOLN
INTRAMUSCULAR | Status: DC | PRN
  Administered 2011-05-20: 5 mg via INTRAVENOUS

## 2011-05-20 SURGICAL SUPPLY — 40 items
APPLIER CLIP ROT 10 11.4 M/L (STAPLE) ×2
BENZOIN TINCTURE PRP APPL 2/3 (GAUZE/BANDAGES/DRESSINGS) IMPLANT
CANISTER SUCTION 2500CC (MISCELLANEOUS) ×2 IMPLANT
CATH REDDICK CHOLANGI 4FR 50CM (CATHETERS) ×2 IMPLANT
CLIP APPLIE ROT 10 11.4 M/L (STAPLE) ×1 IMPLANT
CLOTH BEACON ORANGE TIMEOUT ST (SAFETY) ×2 IMPLANT
COVER MAYO STAND STRL (DRAPES) ×2 IMPLANT
DECANTER SPIKE VIAL GLASS SM (MISCELLANEOUS) ×2 IMPLANT
DERMABOND ADVANCED (GAUZE/BANDAGES/DRESSINGS) ×2
DERMABOND ADVANCED .7 DNX12 (GAUZE/BANDAGES/DRESSINGS) ×2 IMPLANT
DRAPE C-ARM 42X72 X-RAY (DRAPES) ×2 IMPLANT
DRAPE LAPAROSCOPIC ABDOMINAL (DRAPES) ×2 IMPLANT
ELECT REM PT RETURN 9FT ADLT (ELECTROSURGICAL) ×2
ELECTRODE REM PT RTRN 9FT ADLT (ELECTROSURGICAL) ×1 IMPLANT
GLOVE BIOGEL PI IND STRL 7.0 (GLOVE) ×1 IMPLANT
GLOVE BIOGEL PI INDICATOR 7.0 (GLOVE) ×1
GLOVE SS BIOGEL STRL SZ 7.5 (GLOVE) ×1 IMPLANT
GLOVE SUPERSENSE BIOGEL SZ 7.5 (GLOVE) ×1
GOWN STRL NON-REIN LRG LVL3 (GOWN DISPOSABLE) ×2 IMPLANT
GOWN STRL REIN XL XLG (GOWN DISPOSABLE) ×4 IMPLANT
HEMOSTAT SURGICEL 2X14 (HEMOSTASIS) ×2 IMPLANT
HEMOSTAT SURGICEL 4X8 (HEMOSTASIS) IMPLANT
IV CATH 14GX2 1/4 (CATHETERS) IMPLANT
IV SET EXT 30 76VOL 4 MALE LL (IV SETS) IMPLANT
KIT BASIN OR (CUSTOM PROCEDURE TRAY) ×2 IMPLANT
NS IRRIG 1000ML POUR BTL (IV SOLUTION) ×2 IMPLANT
POUCH SPECIMEN RETRIEVAL 10MM (ENDOMECHANICALS) ×2 IMPLANT
SET CHOLANGIOGRAPH MIX (MISCELLANEOUS) ×2 IMPLANT
SET IRRIG TUBING LAPAROSCOPIC (IRRIGATION / IRRIGATOR) ×2 IMPLANT
SLEEVE Z-THREAD 5X100MM (TROCAR) ×2 IMPLANT
SOLUTION ANTI FOG 6CC (MISCELLANEOUS) ×2 IMPLANT
STOPCOCK K 69 2C6206 (IV SETS) ×2 IMPLANT
STRIP CLOSURE SKIN 1/2X4 (GAUZE/BANDAGES/DRESSINGS) IMPLANT
SUT MNCRL AB 4-0 PS2 18 (SUTURE) ×2 IMPLANT
TOWEL OR 17X26 10 PK STRL BLUE (TOWEL DISPOSABLE) ×2 IMPLANT
TRAY LAP CHOLE (CUSTOM PROCEDURE TRAY) ×2 IMPLANT
TROCAR HASSON GELL 12X100 (TROCAR) ×2 IMPLANT
TROCAR Z-THREAD FIOS 11X100 BL (TROCAR) ×2 IMPLANT
TROCAR Z-THREAD FIOS 5X100MM (TROCAR) ×2 IMPLANT
TUBING INSUFFLATION 10FT LAP (TUBING) ×2 IMPLANT

## 2011-05-20 NOTE — Op Note (Signed)
Preoperative diagnosis: Cholelithiasis and cholecystitis  Postoperative diagnosis: Cholelithiasis and cholecystitis  Surgical procedure: Laparoscopic cholecystectomy with intraoperative cholangiogram  Surgeon: Sharlet Salina T. Laneice Meneely M.D.  Assistant: Abigail Miyamoto M.D.  Anesthesia: General Endotracheal  Complications: None  Estimated blood loss: Minimal  Description of procedure: The patient brought to the operating room, placed in the supine position on the operating table, and general endotracheal anesthesia induced. The abdomen was widely sterilely prepped and draped. The patient had received preoperative IV antibiotics and PAS were in place. Patient timeout was performed the correct procedure verified. Standard 4 port technique was used with an open Hassan cannula at the umbilicus and the remainder of the ports placed under direct vision. The gallbladder was visualized. It appeared thickened and significantly chronically infamed. The fundus was grasped and elevated up over the liver and the infundibulum retracted inferiolaterally. Peritoneum anterior and posterior to close triangle was incised and fibrofatty tissue stripped off the neck of the gallbladder toward the porta hepatis. The distal gallbladder was thoroughly dissected. The cystic artery was identified in close triangle and the cystic duct gallbladder junction dissected 360.  A good critical view was obtained. When the anatomy was clear the cystic duct was clipped at the gallbladder junction and an operative cholangiogram obtained through the cystic duct. This showed good filling of a normal common bile duct and intrahepatic ducts with free flow into the duodenum and no filling defects. Following this the Cholangiocath was removed and the cystic duct was doubly clipped proximally and divided. The cystic artery was doubly clipped proximally and distally and divided. The gallbladder was dissected free from its bed using hook cautery and  removed through the umbilical port site. Complete hemostasis was obtained in the gallbladder bed. The right upper quadrant was thoroughly irrigated and hemostasis assured. Trochars were removed and all CO2 evacuated and the [facility] trocar site fascial defect closed. Skin incisions were closed with subcuticular Monocryl and Dermabond. Sponge needle and instrument counts were correct. The patient was taken to PACU in good condition.  Golden Emile T  05/20/2011

## 2011-05-20 NOTE — Anesthesia Preprocedure Evaluation (Addendum)
Anesthesia Evaluation  Patient identified by MRN, date of birth, ID band Patient awake    Reviewed: Allergy & Precautions, H&P , NPO status , Patient's Chart, lab work & pertinent test results  Airway Mallampati: II TM Distance: >3 FB Neck ROM: full    Dental  (+)    Pulmonary neg pulmonary ROS,  clear to auscultation  Pulmonary exam normal       Cardiovascular Exercise Tolerance: Good hypertension, On Medications regular Normal    Neuro/Psych Negative Neurological ROS  Negative Psych ROS   GI/Hepatic negative GI ROS, Neg liver ROS,   Endo/Other  Negative Endocrine ROS  Renal/GU negative Renal ROS  Genitourinary negative   Musculoskeletal   Abdominal   Peds  Hematology negative hematology ROS (+)   Anesthesia Other Findings   Reproductive/Obstetrics negative OB ROS                          Anesthesia Physical Anesthesia Plan  ASA: II  Anesthesia Plan: General   Post-op Pain Management:    Induction: Intravenous  Airway Management Planned: Oral ETT  Additional Equipment:   Intra-op Plan:   Post-operative Plan: Extubation in OR  Informed Consent: I have reviewed the patients History and Physical, chart, labs and discussed the procedure including the risks, benefits and alternatives for the proposed anesthesia with the patient or authorized representative who has indicated his/her understanding and acceptance.   Dental Advisory Given  Plan Discussed with: CRNA  Anesthesia Plan Comments:        Anesthesia Quick Evaluation

## 2011-05-20 NOTE — Preoperative (Signed)
Beta Blockers   Reason not to administer Beta Blockers:Not Applicable, pt not on home BB 

## 2011-05-20 NOTE — Interval H&P Note (Signed)
History and Physical Interval Note:  05/20/2011 11:16 AM  Greg Wallace  has presented today for surgery, with the diagnosis of cholelithiasis  The various methods of treatment have been discussed with the patient and family. After consideration of risks, benefits and other options for treatment, the patient has consented to  Procedure(s): LAPAROSCOPIC CHOLECYSTECTOMY WITH INTRAOPERATIVE CHOLANGIOGRAM as a surgical intervention .  The patients' history has been reviewed, patient examined, no change in status, stable for surgery.  I have reviewed the patients' chart and labs.  Questions were answered to the patient's satisfaction.     Vuk Skillern T

## 2011-05-20 NOTE — H&P (View-Only) (Signed)
Greg Wallace   04/22/2011 1:30 PM Office Visit  MRN: [id number]   Description: 50 year old male  Provider: Mariella Saa, MD  Department: [facility]        Diagnoses     Cholelithiases   - Primary    574.20    Abdominal pain, epigastric     789.06      Reason for Visit     New Evaluation    eval of biliary cholic and abdominal pain, GB with stones on [place]        Vitals - Last Recorded       BP Pulse Temp(Src) Resp Ht Wt    134/86  72  98 F (36.7 C) (Temporal)  16  5\' 10"  (1.778 m)  210 lb 6 oz (95.425 kg)          BMI              30.19 kg/m2                 Progress Notes     Kynlei Piontek T, MD  04/22/2011  2:13 PM  SignedSubjective:     Abdominal pain Patient ID: Greg Wallace, male   DOB: 08/08/1960, 50 y.o.   MRN: [id number]   HPI Patient is a 50 year old male referred for repeated episodes of epigastric pain and new diagnosis of cholelithiasis. The patient states that he has been having episodes of pain for about one year. This was initially treated as possibly reflux. However in recent months he has had more frequent and severe episodes. He describes the onset of a pressure-like pain in his epigastrium and radiates around both flanks to his back. This will last for several hours and will be relieved sometimes by vomiting or he will go to sleep and it will be gone. He recently has found that fatty foods precipitate the attacks and he has avoided them by avoiding greasy or fatty foods. He has not had any fever chills or jaundice. He recently saw his PCP and an ultrasound of the gallbladder and abdomen was obtained which are reviewed. This shows multiple mobile gallstones and normal common bile duct. The patient has been entirely asymptomatic between episodes. No change in bowel habits or urinary symptoms.    Past Medical History   Diagnosis  Date   .  Hypertension     .  Bell's palsy  03/2010       Right   .  GERD (gastroesophageal reflux  disease)      Past Surgical History   Procedure  Date   .  Finger laceration         Stitches   .  Hand injury         Right palm cut with router, s/p CTS    Current Outpatient Prescriptions   Medication  Sig  Dispense  Refill   .  lisinopril-hydrochlorothiazide (PRINZIDE,ZESTORETIC) 10-12.5 MG per tablet  Take 1 tablet by mouth daily.           Marland Kitchen  omeprazole (PRILOSEC) 40 MG capsule  Take 1 capsule (40 mg total) by mouth daily.   90 capsule   3    Allergies   Allergen  Reactions   .  Penicillins         REACTION: Hives    History   Substance Use Topics   .  Smoking status:  Never Smoker    .  Smokeless tobacco:  Not on file   .  Alcohol Use:  No        Review of Systems  Constitutional: Negative.   HENT: Negative.   Respiratory: Negative.   Cardiovascular: Negative.   Gastrointestinal: Positive for nausea and abdominal pain. Negative for diarrhea, constipation and blood in stool.  Genitourinary: Negative.   Hematological: Negative.       Objective:    Physical Exam Gen.: Mildly overweight otherwise healthy-appearing Caucasian male Skin: Warm and dry without rash or infection HEENT: No palpable masses or thyromegaly. Sclera nonicteric. Pupils equal and reactive. Lymph nodes: No palpable cervical supraclavicular or inguinal nodes Lungs: Breath sounds clear and equal without increased work of breathing Cardiovascular: Regular rate and rhythm without murmur. No JVD or edema. Peripheral pulses intact. Abdomen: Nondistended. Soft and nontender. No palpable masses or organomegaly. No hernias detected. Extremities: No joint swelling or deformity Neurologic: Alert and fully oriented. Gait normal.   Labs: Recent lab work showed normal liver function tests and mildly elevated white count at 13,000   Assessment:      50 year old male with repeated episodes of typical biliary colic and gallstones documented on ultrasound. I recommended proceeding with laparoscopic  cholecystectomy to relieve his symptoms and prevent complications from his gallstones. We discussed the nature of the procedure, its indications, recovery, and risks of bleeding, infection, anesthetic complications, bile leak, and bile duct or visceral injury. They were given complete literature regarding the procedure. We'll schedule this later in the month at his convenience.   Plan:      Laparoscopic cholecystectomy with cholangiogram under general anesthesia as an outpatient.                Not recorded       Discontinued Medications         Reason for Discontinue    omeprazole (PRILOSEC) 40 MG capsule Error    ciprofloxacin (CIPRO) 500 MG tablet Error    metroNIDAZOLE (FLAGYL) 500 MG tablet Error           Patient Instructions     Laparoscopic Cholecystectomy  Laparoscopic cholecystectomy is surgery to remove the gallbladder. The gallbladder is located slightly to the right of center in the abdomen, behind the liver. It is a concentrating and storage sac for the bile produced in the liver. Bile aids in the digestion and absorption of fats. Gallbladder disease (cholecystitis) is an inflammation of your gallbladder. This condition is usually caused by a buildup of gallstones (cholelithiasis) in your gallbladder. Gallstones can block the flow of bile, resulting in inflammation and pain. In severe cases, emergency surgery may be required. When emergency surgery is not required, you will have time to prepare for the procedure. Laparoscopic surgery is an alternative to open surgery. Laparoscopic surgery usually has a shorter recovery time. Your common bile duct may also need to be examined and explored. Your caregiver will discuss this with you if he or she feels this should be done. If stones are found in the common bile duct, they may be removed. LET YOUR CAREGIVER KNOW ABOUT: Allergies to food or medicine.   Medicines taken, including vitamins, herbs, eyedrops,  over-the-counter medicines, and creams.   Use of steroids (by mouth or creams).   Previous problems with anesthetics or numbing medicines.   History of bleeding problems or blood clots.   Previous surgery.   Other health problems, including diabetes and kidney problems.   Possibility of pregnancy, if this applies.  RISKS AND COMPLICATIONS  All surgery is associated with risks. Some problems that  may occur following this procedure include: Infection.   Damage to the common bile duct, nerves, arteries, veins, or other internal organs such as the stomach or intestines.   Bleeding.   A stone may remain in the common bile duct.  BEFORE THE PROCEDURE Do not take aspirin for 3 days prior to surgery or blood thinners for 1 week prior to surgery.   Do not eat or drink anything after midnight the night before surgery.   Let your caregiver know if you develop a cold or other infectious problem prior to surgery.   You should be present 60 minutes before the procedure or as directed.  PROCEDURE   You will be given medicine that makes you sleep (general anesthetic). When you are asleep, your surgeon will make several small cuts (incisions) in your abdomen. One of these incisions is used to insert a small, lighted scope (laparoscope) into the abdomen. The laparoscope helps the surgeon see into your abdomen. Carbon dioxide gas will be pumped into your abdomen. The gas allows more room for the surgeon to perform your surgery. Other operating instruments are inserted through the other incisions. Laparoscopic procedures may not be appropriate when: There is major scarring from previous surgery.   The gallbladder is extremely inflamed.   There are bleeding disorders or unexpected cirrhosis of the liver.   A pregnancy is near term.   Other conditions make the laparoscopic procedure impossible.  If your surgeon feels it is not safe to continue with a laparoscopic procedure, he or she will perform an open  abdominal procedure. In this case, the surgeon will make an incision to open the abdomen. This gives the surgeon a larger view and field to work within. This may allow the surgeon to perform procedures that sometimes cannot be performed with a laparoscope alone. Open surgery has a longer recovery time. AFTER THE PROCEDURE You will be taken to the recovery area where a nurse will watch and check your progress.   You may be allowed to go home the same day.   Do not resume physical activities until directed by your caregiver.   You may resume a normal diet and activities as directed.  Document Released: 06/06/2005 Document Revised: 02/16/2011 Document Reviewed: 11/19/2010 [facility] Patient Information 2012 [place], [place].       Level of Service     PR OFFICE CONSULTATION,LEVEL III [id number]      Follow-up and Disposition     Return After surgery.        All Flowsheet Templates (all recorded)     Encounter Vitals Flowsheet    Custom Formula Data Flowsheet    Anthropometrics Flowsheet               Referring Provider          Eustaquio Boyden, MD       All Charges for This Encounter       Code Description Service Date Service Provider Modifiers Quantity    [phone] PR OFFICE CONSULTATION,LEVEL III 04/22/2011 Mariella Saa, MD   1        Other Encounter Related Information     Allergies & Medications         Problem List         History         Patient-Entered Questionnaires            Greg Wallace   04/22/2011 1:30 PM Office Visit  MRN: [id number]   Description: 50 year  old male  Provider: Mariella Saa, MD  Department: [facility]        Diagnoses     Cholelithiases   - Primary    574.20    Abdominal pain, epigastric     789.06      Reason for Visit     New Evaluation    eval of biliary cholic and abdominal pain, GB with stones on [place]        Vitals - Last Recorded       BP Pulse Temp(Src) Resp Ht Wt    134/86  72  98 F (36.7  C) (Temporal)  16  5\' 10"  (1.778 m)  210 lb 6 oz (95.425 kg)          BMI              30.19 kg/m2                 Progress Notes     Symir Mah T, MD  04/22/2011  2:13 PM  SignedSubjective:     Abdominal pain Patient ID: Greg Wallace, male   DOB: 1960-12-22, 50 y.o.   MRN: [id number]   HPI Patient is a 50 year old male referred for repeated episodes of epigastric pain and new diagnosis of cholelithiasis. The patient states that he has been having episodes of pain for about one year. This was initially treated as possibly reflux. However in recent months he has had more frequent and severe episodes. He describes the onset of a pressure-like pain in his epigastrium and radiates around both flanks to his back. This will last for several hours and will be relieved sometimes by vomiting or he will go to sleep and it will be gone. He recently has found that fatty foods precipitate the attacks and he has avoided them by avoiding greasy or fatty foods. He has not had any fever chills or jaundice. He recently saw his PCP and an ultrasound of the gallbladder and abdomen was obtained which are reviewed. This shows multiple mobile gallstones and normal common bile duct. The patient has been entirely asymptomatic between episodes. No change in bowel habits or urinary symptoms.    Past Medical History   Diagnosis  Date   .  Hypertension     .  Bell's palsy  03/2010       Right   .  GERD (gastroesophageal reflux disease)      Past Surgical History   Procedure  Date   .  Finger laceration         Stitches   .  Hand injury         Right palm cut with router, s/p CTS    Current Outpatient Prescriptions   Medication  Sig  Dispense  Refill   .  lisinopril-hydrochlorothiazide (PRINZIDE,ZESTORETIC) 10-12.5 MG per tablet  Take 1 tablet by mouth daily.           Marland Kitchen  omeprazole (PRILOSEC) 40 MG capsule  Take 1 capsule (40 mg total) by mouth daily.   90 capsule   3    Allergies   Allergen   Reactions   .  Penicillins         REACTION: Hives    History   Substance Use Topics   .  Smoking status:  Never Smoker    .  Smokeless tobacco:  Not on file   .  Alcohol Use:  No        Review of Systems  Constitutional: Negative.   HENT: Negative.   Respiratory: Negative.   Cardiovascular: Negative.   Gastrointestinal: Positive for nausea and abdominal pain. Negative for diarrhea, constipation and blood in stool.  Genitourinary: Negative.   Hematological: Negative.       Objective:    Physical Exam Gen.: Mildly overweight otherwise healthy-appearing Caucasian male Skin: Warm and dry without rash or infection HEENT: No palpable masses or thyromegaly. Sclera nonicteric. Pupils equal and reactive. Lymph nodes: No palpable cervical supraclavicular or inguinal nodes Lungs: Breath sounds clear and equal without increased work of breathing Cardiovascular: Regular rate and rhythm without murmur. No JVD or edema. Peripheral pulses intact. Abdomen: Nondistended. Soft and nontender. No palpable masses or organomegaly. No hernias detected. Extremities: No joint swelling or deformity Neurologic: Alert and fully oriented. Gait normal.   Labs: Recent lab work showed normal liver function tests and mildly elevated white count at 13,000   Assessment:      50 year old male with repeated episodes of typical biliary colic and gallstones documented on ultrasound. I recommended proceeding with laparoscopic cholecystectomy to relieve his symptoms and prevent complications from his gallstones. We discussed the nature of the procedure, its indications, recovery, and risks of bleeding, infection, anesthetic complications, bile leak, and bile duct or visceral injury. They were given complete literature regarding the procedure. We'll schedule this later in the month at his convenience.   Plan:      Laparoscopic cholecystectomy with cholangiogram under general anesthesia as an outpatient.                 Not recorded       Discontinued Medications         Reason for Discontinue    omeprazole (PRILOSEC) 40 MG capsule Error    ciprofloxacin (CIPRO) 500 MG tablet Error    metroNIDAZOLE (FLAGYL) 500 MG tablet Error           Patient Instructions     Laparoscopic Cholecystectomy  Laparoscopic cholecystectomy is surgery to remove the gallbladder. The gallbladder is located slightly to the right of center in the abdomen, behind the liver. It is a concentrating and storage sac for the bile produced in the liver. Bile aids in the digestion and absorption of fats. Gallbladder disease (cholecystitis) is an inflammation of your gallbladder. This condition is usually caused by a buildup of gallstones (cholelithiasis) in your gallbladder. Gallstones can block the flow of bile, resulting in inflammation and pain. In severe cases, emergency surgery may be required. When emergency surgery is not required, you will have time to prepare for the procedure. Laparoscopic surgery is an alternative to open surgery. Laparoscopic surgery usually has a shorter recovery time. Your common bile duct may also need to be examined and explored. Your caregiver will discuss this with you if he or she feels this should be done. If stones are found in the common bile duct, they may be removed. LET YOUR CAREGIVER KNOW ABOUT: Allergies to food or medicine.   Medicines taken, including vitamins, herbs, eyedrops, over-the-counter medicines, and creams.   Use of steroids (by mouth or creams).   Previous problems with anesthetics or numbing medicines.   History of bleeding problems or blood clots.   Previous surgery.   Other health problems, including diabetes and kidney problems.   Possibility of pregnancy, if this applies.  RISKS AND COMPLICATIONS  All surgery is associated with risks. Some problems that may occur following this procedure include: Infection.   Damage to the common bile duct, nerves,  arteries, veins, or other internal organs such as the stomach or intestines.   Bleeding.   A stone may remain in the common bile duct.  BEFORE THE PROCEDURE Do not take aspirin for 3 days prior to surgery or blood thinners for 1 week prior to surgery.   Do not eat or drink anything after midnight the night before surgery.   Let your caregiver know if you develop a cold or other infectious problem prior to surgery.   You should be present 60 minutes before the procedure or as directed.  PROCEDURE   You will be given medicine that makes you sleep (general anesthetic). When you are asleep, your surgeon will make several small cuts (incisions) in your abdomen. One of these incisions is used to insert a small, lighted scope (laparoscope) into the abdomen. The laparoscope helps the surgeon see into your abdomen. Carbon dioxide gas will be pumped into your abdomen. The gas allows more room for the surgeon to perform your surgery. Other operating instruments are inserted through the other incisions. Laparoscopic procedures may not be appropriate when: There is major scarring from previous surgery.   The gallbladder is extremely inflamed.   There are bleeding disorders or unexpected cirrhosis of the liver.   A pregnancy is near term.   Other conditions make the laparoscopic procedure impossible.  If your surgeon feels it is not safe to continue with a laparoscopic procedure, he or she will perform an open abdominal procedure. In this case, the surgeon will make an incision to open the abdomen. This gives the surgeon a larger view and field to work within. This may allow the surgeon to perform procedures that sometimes cannot be performed with a laparoscope alone. Open surgery has a longer recovery time. AFTER THE PROCEDURE You will be taken to the recovery area where a nurse will watch and check your progress.   You may be allowed to go home the same day.   Do not resume physical activities until directed  by your caregiver.   You may resume a normal diet and activities as directed.  Document Released: 06/06/2005 Document Revised: 02/16/2011 Document Reviewed: 11/19/2010 [facility] Patient Information 2012 [place], [place].       Level of Service     PR OFFICE CONSULTATION,LEVEL III [id number]      Follow-up and Disposition     Return After surgery.        All Flowsheet Templates (all recorded)     Encounter Vitals Flowsheet    Custom Formula Data Flowsheet    Anthropometrics Flowsheet               Referring Provider          Eustaquio Boyden, MD

## 2011-05-20 NOTE — Anesthesia Postprocedure Evaluation (Signed)
  Anesthesia Post-op Note  Patient: Greg Wallace  Procedure(s) Performed:  LAPAROSCOPIC CHOLECYSTECTOMY WITH INTRAOPERATIVE CHOLANGIOGRAM  Patient Location: PACU  Anesthesia Type: General  Level of Consciousness: awake and alert   Airway and Oxygen Therapy: Patient Spontanous Breathing  Post-op Pain: mild  Post-op Assessment: Post-op Vital signs reviewed, Patient's Cardiovascular Status Stable, Respiratory Function Stable, Patent Airway and No signs of Nausea or vomiting  Post-op Vital Signs: stable  Complications: No apparent anesthesia complications

## 2011-05-20 NOTE — Transfer of Care (Signed)
Immediate Anesthesia Transfer of Care Note  Patient: Greg Wallace  Procedure(s) Performed:  LAPAROSCOPIC CHOLECYSTECTOMY WITH INTRAOPERATIVE CHOLANGIOGRAM  Patient Location: PACU  Anesthesia Type: General  Level of Consciousness: awake, alert , oriented and patient cooperative  Airway & Oxygen Therapy: Patient Spontanous Breathing and Patient connected to face mask  Post-op Assessment: Report given to PACU RN and Post -op Vital signs reviewed and stable  Post vital signs: Reviewed and stable  Complications: No apparent anesthesia complications

## 2011-05-23 ENCOUNTER — Encounter: Payer: 59 | Admitting: Family Medicine

## 2011-05-23 ENCOUNTER — Encounter (HOSPITAL_COMMUNITY): Payer: Self-pay | Admitting: General Surgery

## 2011-05-24 ENCOUNTER — Telehealth (INDEPENDENT_AMBULATORY_CARE_PROVIDER_SITE_OTHER): Payer: Self-pay

## 2011-05-24 NOTE — Telephone Encounter (Signed)
Mr. Pavek reports he's doing well since surgery, he was given his post op appt date & time

## 2011-06-17 ENCOUNTER — Encounter (INDEPENDENT_AMBULATORY_CARE_PROVIDER_SITE_OTHER): Payer: 59 | Admitting: General Surgery

## 2012-03-01 ENCOUNTER — Other Ambulatory Visit (INDEPENDENT_AMBULATORY_CARE_PROVIDER_SITE_OTHER): Payer: 59

## 2012-03-01 DIAGNOSIS — E785 Hyperlipidemia, unspecified: Secondary | ICD-10-CM

## 2012-03-01 DIAGNOSIS — I10 Essential (primary) hypertension: Secondary | ICD-10-CM

## 2012-03-01 DIAGNOSIS — Z026 Encounter for examination for insurance purposes: Secondary | ICD-10-CM

## 2012-03-01 NOTE — Addendum Note (Signed)
Addended by: Liane Comber C on: 03/01/2012 11:31 AM   Modules accepted: Orders

## 2012-03-04 ENCOUNTER — Encounter: Payer: Self-pay | Admitting: Family Medicine

## 2012-03-04 ENCOUNTER — Other Ambulatory Visit: Payer: Self-pay | Admitting: Family Medicine

## 2012-03-06 ENCOUNTER — Ambulatory Visit (INDEPENDENT_AMBULATORY_CARE_PROVIDER_SITE_OTHER): Payer: 59 | Admitting: Family Medicine

## 2012-03-06 ENCOUNTER — Telehealth: Payer: Self-pay

## 2012-03-06 ENCOUNTER — Encounter: Payer: Self-pay | Admitting: Family Medicine

## 2012-03-06 VITALS — BP 148/98 | HR 76 | Temp 97.9°F | Wt 219.8 lb

## 2012-03-06 DIAGNOSIS — Z0000 Encounter for general adult medical examination without abnormal findings: Secondary | ICD-10-CM | POA: Insufficient documentation

## 2012-03-06 DIAGNOSIS — E785 Hyperlipidemia, unspecified: Secondary | ICD-10-CM

## 2012-03-06 DIAGNOSIS — N529 Male erectile dysfunction, unspecified: Secondary | ICD-10-CM | POA: Insufficient documentation

## 2012-03-06 DIAGNOSIS — Z1211 Encounter for screening for malignant neoplasm of colon: Secondary | ICD-10-CM

## 2012-03-06 DIAGNOSIS — I10 Essential (primary) hypertension: Secondary | ICD-10-CM

## 2012-03-06 LAB — COMPREHENSIVE METABOLIC PANEL
ALT: 36 IU/L (ref 0–44)
AST: 29 IU/L (ref 0–40)
Albumin/Globulin Ratio: 1.7 (ref 1.1–2.5)
Alkaline Phosphatase: 73 IU/L (ref 44–102)
BUN/Creatinine Ratio: 14 (ref 9–20)
CO2: 25 mmol/L (ref 19–28)
Calcium: 9.7 mg/dL (ref 8.7–10.2)
GFR calc non Af Amer: 70 mL/min/{1.73_m2} (ref >59)
Potassium: 4.4 mmol/L (ref 3.5–5.2)
Sodium: 142 mmol/L (ref 134–144)

## 2012-03-06 LAB — NICOTINE/COTININE METABOLITES
Cotinine: NOT DETECTED ng/mL
Nicotine: NOT DETECTED ng/mL

## 2012-03-06 LAB — CBC WITH DIFFERENTIAL/PLATELET
Eosinophils Absolute: 0.1 10*3/uL (ref 0.0–0.4)
Immature Grans (Abs): 0 10*3/uL (ref 0.0–0.1)
Immature Granulocytes: 0 % (ref 0–2)
Lymphocytes Absolute: 2 10*3/uL (ref 0.7–3.1)
MCH: 29.9 pg (ref 26.6–33.0)
MCV: 89 fL (ref 79–97)
Monocytes Absolute: 0.6 10*3/uL (ref 0.1–0.9)
Neutrophils Relative %: 59 % (ref 40–74)
RDW: 13.6 % (ref 12.3–15.4)

## 2012-03-06 LAB — LIPID PANEL WITH LDL/HDL RATIO
HDL: 33 mg/dL — ABNORMAL LOW (ref >39)
LDl/HDL Ratio: 2.6 ratio units (ref 0.0–3.6)

## 2012-03-06 MED ORDER — SILDENAFIL CITRATE 50 MG PO TABS: 50.0000 mg | ORAL_TABLET | Freq: Every day | ORAL | Status: DC | PRN

## 2012-03-06 MED ORDER — LISINOPRIL-HYDROCHLOROTHIAZIDE 10-12.5 MG PO TABS: 1.0000 | ORAL_TABLET | Freq: Every day | ORAL | Status: DC

## 2012-03-06 NOTE — Assessment & Plan Note (Signed)
Reviewed #s, discussed changes to improve cholesterol levels. Low HDL, high trig.

## 2012-03-06 NOTE — Assessment & Plan Note (Signed)
Chronic. Deteriorated off meds. Restart and return in 1-2 wks for blood work.

## 2012-03-06 NOTE — Patient Instructions (Addendum)
Stool kit today. Return in 1-2 weeks for labwork to check kidney function on blood pressure medicine. Keep an eye on blood pressures and let me know if consistently >140/90. Good to see you today, call us with questions. I've sent in blood pressure medicine to [facility] and provided you with printed yearlong script.

## 2012-03-06 NOTE — Assessment & Plan Note (Signed)
Preventative protocols reviewed and updated unless pt declined. Declines flu Requests deferral of prostate screening. Blood work reviewed in detail. Discussed healthy diet/lifestyle.

## 2012-03-06 NOTE — Telephone Encounter (Signed)
called back, left message.

## 2012-03-06 NOTE — Telephone Encounter (Signed)
Discussed with pt

## 2012-03-06 NOTE — Assessment & Plan Note (Signed)
Pt will let me know if desires viagra in future. Anticipate organic ED.

## 2012-03-06 NOTE — Progress Notes (Signed)
Subjective:    Patient ID: Greg Wallace, male    DOB: Feb 03, 1961, 51 y.o.   MRN: [id number]  HPI CC: CPE  Off bp med for last 6 weeks - ran out.  No HA, vision changes, CP/tightness, SOB, leg swelling. Has bp cuff at home.    noticing some ED for last few months, may have worsened off bp med.  Maintenance of erection.  Twice achieving erection.  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  Working on diet and has noted weight loss  Preventative:  prostate screening - no family members with cancer. requests to postpone discussion of prostate screen (PSA and DRE) to next year.  No nocturia, strong stream. colon screening - No BM changes, no blood in stool. iFOB - never returned last year. Declines flu shot. Td 2009 Seat belt use discussed. Sunscreen and hat use discussed.  Labs from [facility].  Medications and allergies reviewed and updated in chart.  Past histories reviewed and updated if relevant as below. Patient Active Problem List  Diagnosis  . OBESITY  . BELL'S PALSY, RIGHT  . HYPERTENSION  . Abdominal pain, epigastric  . DYSLIPIDEMIA   Past Medical History  Diagnosis Date  . Hypertension   . Bell's palsy 03/2010    Right  . GERD (gastroesophageal reflux disease)   . Anxiety    Past Surgical History  Procedure Date  . Finger laceration     Stitches  . Hand injury     Right palm cut with router, s/p CTS  . Cholecystectomy 05/20/2011    Procedure: LAPAROSCOPIC CHOLECYSTECTOMY WITH INTRAOPERATIVE CHOLANGIOGRAM;  Surgeon: Greg Saa, MD   History  Substance Use Topics  . Smoking status: Never Smoker   . Smokeless tobacco: Never Used  . Alcohol Use: No   Family History  Problem Relation Age of Onset  . Hypertension Mother   . Cancer Father     Lung (smoking)  . Diabetes Neg Hx   . Heart  disease Neg Hx     CAD, MI  . Stroke Neg Hx    Allergies  Allergen Reactions  . Penicillins     REACTION: Hives   Current Outpatient Prescriptions on File Prior to Visit  Medication Sig Dispense Refill  . DISCONTD: lisinopril-hydrochlorothiazide (PRINZIDE,ZESTORETIC) 10-12.5 MG per tablet Take 1 tablet by mouth daily.          Review of Systems  Constitutional: Negative for fever, chills, activity change, appetite change, fatigue and unexpected weight change.  HENT: Negative for hearing loss and neck pain.   Eyes: Negative for visual disturbance.  Respiratory: Negative for cough, chest tightness, shortness of breath and wheezing.   Cardiovascular: Negative for chest pain, palpitations and leg swelling.  Gastrointestinal: Negative for nausea, vomiting, abdominal pain, diarrhea, constipation, blood in stool and abdominal distention.  Genitourinary: Negative for hematuria and difficulty urinating.  Musculoskeletal: Negative for myalgias and arthralgias.  Skin: Negative for rash.  Neurological: Negative for dizziness, seizures, syncope and headaches.  Hematological: Does not bruise/bleed easily.  Psychiatric/Behavioral: Negative for dysphoric mood. The patient is not nervous/anxious.        Objective:   Physical Exam  Nursing note and vitals reviewed. Constitutional: He is oriented to person, place, and time. He appears well-developed and well-nourished. No distress.  HENT:  Head: Normocephalic and atraumatic.  Right Ear: Hearing,  tympanic membrane, external ear and ear canal normal.  Left Ear: Hearing, tympanic membrane, external ear and ear canal normal.  Nose: Nose normal. No mucosal edema or rhinorrhea.  Mouth/Throat: Oropharynx is clear and moist and mucous membranes are normal. No oropharyngeal exudate, posterior oropharyngeal edema, posterior oropharyngeal erythema or tonsillar abscesses.  Eyes: Conjunctivae normal and EOM are normal. Pupils are equal, round, and reactive  to light. No scleral icterus.  Neck: Normal range of motion. Neck supple.  Cardiovascular: Normal rate, regular rhythm, normal heart sounds and intact distal pulses.   No murmur heard. Pulses:      Radial pulses are 2+ on the right side, and 2+ on the left side.  Pulmonary/Chest: Effort normal and breath sounds normal. No respiratory distress. He has no wheezes. He has no rales.  Abdominal: Soft. Bowel sounds are normal. He exhibits no distension and no mass. There is no tenderness. There is no rebound and no guarding.  Musculoskeletal: Normal range of motion. He exhibits no edema.  Lymphadenopathy:    He has no cervical adenopathy.  Neurological: He is alert and oriented to person, place, and time.       CN grossly intact, station and gait intact  Skin: Skin is warm and dry. No rash noted.  Psychiatric: He has a normal mood and affect. His behavior is normal. Judgment and thought content normal.       Assessment & Plan:

## 2012-03-06 NOTE — Telephone Encounter (Signed)
Pt request call back from Dr Sharen Hones at [phone]; will not leave any add'l info.

## 2012-03-20 ENCOUNTER — Other Ambulatory Visit (INDEPENDENT_AMBULATORY_CARE_PROVIDER_SITE_OTHER): Payer: 59

## 2012-03-20 DIAGNOSIS — Z0000 Encounter for general adult medical examination without abnormal findings: Secondary | ICD-10-CM

## 2012-03-20 DIAGNOSIS — I10 Essential (primary) hypertension: Secondary | ICD-10-CM

## 2012-03-20 LAB — BASIC METABOLIC PANEL
CO2: 30 mEq/L (ref 19–32)
Calcium: 9.6 mg/dL (ref 8.4–10.5)
GFR: 62.97 mL/min (ref >60.00)
Glucose, Bld: 89 mg/dL (ref 70–99)
Potassium: 3.9 mEq/L (ref 3.5–5.1)
Sodium: 138 mEq/L (ref 135–145)

## 2013-01-15 ENCOUNTER — Telehealth: Payer: Self-pay | Admitting: Family Medicine

## 2013-01-15 ENCOUNTER — Other Ambulatory Visit: Payer: Self-pay | Admitting: Family Medicine

## 2013-01-15 DIAGNOSIS — E669 Obesity, unspecified: Secondary | ICD-10-CM

## 2013-01-15 DIAGNOSIS — Z0000 Encounter for general adult medical examination without abnormal findings: Secondary | ICD-10-CM

## 2013-01-15 NOTE — Telephone Encounter (Signed)
I never received any fax concerning this. Pt is not a medicare patient. Dr. [name] are you aware of any fax concerning labs?

## 2013-01-15 NOTE — Telephone Encounter (Addendum)
I did not receive records either - but will order normal blood work. plz ask him to drop me copy of request when comes in for blood work. Also clarify with patient -looks like last physical was 03/06/2012 - is he due so soon?

## 2013-01-15 NOTE — Telephone Encounter (Signed)
Pt's wife left vm stating she faxed over form listing the required labs pt needs for his Medicare Wellness Visit per insurance.  Pt is coming to have labs drawn tomorrow.

## 2013-01-16 ENCOUNTER — Other Ambulatory Visit (INDEPENDENT_AMBULATORY_CARE_PROVIDER_SITE_OTHER): Payer: 59

## 2013-01-16 DIAGNOSIS — E669 Obesity, unspecified: Secondary | ICD-10-CM

## 2013-01-16 DIAGNOSIS — Z0000 Encounter for general adult medical examination without abnormal findings: Secondary | ICD-10-CM

## 2013-01-16 NOTE — Telephone Encounter (Signed)
Pt had labs this am, he brought in paperwork ( labs requested by insurance) all the tests required on paperwork were already ordered by Dr. He will bring paperwork back with him when he returns for his cpx on 01/18/13

## 2013-01-17 NOTE — Telephone Encounter (Signed)
Message left for patient to return my call.  

## 2013-01-18 ENCOUNTER — Encounter: Payer: Self-pay | Admitting: Family Medicine

## 2013-01-18 ENCOUNTER — Ambulatory Visit (INDEPENDENT_AMBULATORY_CARE_PROVIDER_SITE_OTHER): Payer: 59 | Admitting: Family Medicine

## 2013-01-18 VITALS — BP 126/84 | HR 80 | Temp 98.0°F | Ht 69.0 in | Wt 220.8 lb

## 2013-01-18 DIAGNOSIS — Z0000 Encounter for general adult medical examination without abnormal findings: Secondary | ICD-10-CM

## 2013-01-18 DIAGNOSIS — E669 Obesity, unspecified: Secondary | ICD-10-CM

## 2013-01-18 DIAGNOSIS — R7309 Other abnormal glucose: Secondary | ICD-10-CM

## 2013-01-18 DIAGNOSIS — E785 Hyperlipidemia, unspecified: Secondary | ICD-10-CM

## 2013-01-18 DIAGNOSIS — I10 Essential (primary) hypertension: Secondary | ICD-10-CM

## 2013-01-18 DIAGNOSIS — Z1211 Encounter for screening for malignant neoplasm of colon: Secondary | ICD-10-CM

## 2013-01-18 DIAGNOSIS — R7303 Prediabetes: Secondary | ICD-10-CM

## 2013-01-18 NOTE — Patient Instructions (Addendum)
Stool kit today. Good to see you today, call us with questions. Decrease added sugars, eliminate trans fats, increase fiber and limit alcohol.  All these changes together can drop triglycerides by almost 50%. Return in 6 months for labwork to recheck triglyceride level after above diet changes.

## 2013-01-18 NOTE — Assessment & Plan Note (Signed)
Borderline A1c. Continue to monitor.

## 2013-01-18 NOTE — Assessment & Plan Note (Signed)
Body mass index is 32.58 kg/(m^2). Pt motivated for weight loss.

## 2013-01-18 NOTE — Assessment & Plan Note (Signed)
Chronic, stable. Continue ACEI/HCTZ 

## 2013-01-18 NOTE — Assessment & Plan Note (Addendum)
Reviewed #s Low HDL, high trig. Discussed trig diet changes.  Encouraged increased fish in diet recheck in 6 mo, then consider statin/fish oil.

## 2013-01-18 NOTE — Assessment & Plan Note (Signed)
Preventative protocols reviewed and updated unless pt declined. Discussed healthy diet and lifestyle. Defer prostate screening

## 2013-01-18 NOTE — Progress Notes (Signed)
Subjective:    Patient ID: Greg Wallace, male    DOB: 04/01/1961, 52 y.o.   MRN: [id number]  HPI CC: CPE  Changing employment.  To start full time at [employer].  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 Working on diet and has noted weight loss   Preventative:  Prostate cancer screening - no family members with cancer. requests to postpone discussion of prostate screen (PSA and DRE) to next year. No nocturia, strong stream.  Colon cancer screening - No BM changes, no blood in stool. iFOB - never returned last year.  Declines flu shot.  Td 2009   Seat belt use discussed.  Sunscreen and hat use discussed.  Labs from [facility].  Medications and allergies reviewed and updated in chart.  Past histories reviewed and updated if relevant as below. Patient Active Problem List   Diagnosis Date Noted  . Healthcare maintenance 03/06/2012  . ED (erectile dysfunction) 03/06/2012  . DYSLIPIDEMIA 06/07/2010  . OBESITY 05/07/2010  . HYPERTENSION 05/07/2010  . BELL'S PALSY, RIGHT 04/06/2010   Past Medical History  Diagnosis Date  . Hypertension   . Bell's palsy 03/2010    Right  . GERD (gastroesophageal reflux disease)   . Anxiety    Past Surgical History  Procedure Laterality Date  . Finger laceration      Stitches  . Hand injury      Right palm cut with router, s/p CTS  . Cholecystectomy  05/20/2011    Procedure: LAPAROSCOPIC CHOLECYSTECTOMY WITH INTRAOPERATIVE CHOLANGIOGRAM;  Surgeon: Mariella Saa, MD   History  Substance Use Topics  . Smoking status: Never Smoker   . Smokeless tobacco: Never Used  . Alcohol Use: No   Family History   Problem Relation Age of Onset  . Hypertension Mother   . Cancer Father     Lung (smoking)  . Diabetes Neg Hx   . Heart disease Neg Hx     CAD, MI  . Stroke Neg Hx    Allergies  Allergen Reactions  . Penicillins     REACTION: Hives   Current Outpatient Prescriptions on File Prior to Visit  Medication Sig Dispense Refill  . lisinopril-hydrochlorothiazide (PRINZIDE,ZESTORETIC) 10-12.5 MG per tablet Take 1 tablet by mouth daily.  30 tablet  3  . Multiple Vitamins-Minerals (MULTIVITAMIN PO) Take 1 tablet by mouth daily.      . sildenafil (VIAGRA) 50 MG tablet Take 1 tablet (50 mg total) by mouth daily as needed for erectile dysfunction.  10 tablet  0   No current facility-administered medications on file prior to visit.      Review of Systems  Constitutional: Negative for fever, chills, activity change, appetite change, fatigue and unexpected weight change.  HENT: Negative for hearing loss and neck pain.   Eyes: Negative for visual disturbance.  Respiratory: Negative for cough, chest tightness, shortness of breath and wheezing.   Cardiovascular: Negative for chest pain, palpitations and leg swelling.  Gastrointestinal: Negative for nausea, vomiting, abdominal pain, diarrhea, constipation, blood in stool and abdominal distention.  Genitourinary: Negative for hematuria and difficulty urinating.  Musculoskeletal: Negative for myalgias and  arthralgias.  Skin: Negative for rash.  Neurological: Negative for dizziness, seizures, syncope and headaches.  Hematological: Negative for adenopathy. Does not bruise/bleed easily.  Psychiatric/Behavioral: Negative for dysphoric mood. The patient is not nervous/anxious.        Objective:   Physical Exam  Nursing note and vitals reviewed. Constitutional: He is oriented to person, place, and time. He appears well-developed and well-nourished. No distress.  HENT:  Head: Normocephalic and atraumatic.  Right Ear: Hearing, tympanic membrane,  external ear and ear canal normal.  Left Ear: Hearing, tympanic membrane, external ear and ear canal normal.  Nose: Nose normal. No mucosal edema or rhinorrhea.  Mouth/Throat: Uvula is midline, oropharynx is clear and moist and mucous membranes are normal. No oropharyngeal exudate, posterior oropharyngeal edema, posterior oropharyngeal erythema or tonsillar abscesses.  Eyes: Conjunctivae and EOM are normal. Pupils are equal, round, and reactive to light. No scleral icterus.  Neck: Normal range of motion. Neck supple. No thyromegaly present.  Cardiovascular: Normal rate, regular rhythm, normal heart sounds and intact distal pulses.   No murmur heard. Pulses:      Radial pulses are 2+ on the right side, and 2+ on the left side.  Pulmonary/Chest: Effort normal and breath sounds normal. No respiratory distress. He has no wheezes. He has no rales.  Abdominal: Soft. Bowel sounds are normal. He exhibits no distension and no mass. There is no tenderness. There is no rebound and no guarding.  Musculoskeletal: Normal range of motion. He exhibits no edema.  R index finger chronic deformity  Lymphadenopathy:    He has no cervical adenopathy.  Neurological: He is alert and oriented to person, place, and time.  CN grossly intact, station and gait intact  Skin: Skin is warm and dry. No rash noted.  Psychiatric: He has a normal mood and affect. His behavior is normal. Judgment and thought content normal.       Assessment & Plan:

## 2013-01-19 LAB — COMPREHENSIVE METABOLIC PANEL
ALT: 42 IU/L (ref 0–44)
AST: 27 IU/L (ref 0–40)
Albumin: 4.4 g/dL (ref 3.5–5.5)
Alkaline Phosphatase: 70 IU/L (ref 39–117)
BUN/Creatinine Ratio: 15 (ref 9–20)
BUN: 16 mg/dL (ref 6–24)
CO2: 28 mmol/L (ref 18–29)
Chloride: 102 mmol/L (ref 97–108)
Potassium: 4.4 mmol/L (ref 3.5–5.2)
Sodium: 142 mmol/L (ref 134–144)

## 2013-01-19 LAB — LIPID PANEL
Cholesterol, Total: 173 mg/dL (ref 100–199)
HDL: 30 mg/dL — ABNORMAL LOW (ref >39)
LDL Calculated: 72 mg/dL (ref 0–99)
VLDL Cholesterol Cal: 71 mg/dL — ABNORMAL HIGH (ref 5–40)

## 2013-01-19 LAB — HEMOGLOBIN A1C
Est. average glucose Bld gHb Est-mCnc: 117 mg/dL
Hgb A1c MFr Bld: 5.7 % — ABNORMAL HIGH (ref 4.8–5.6)

## 2013-02-05 ENCOUNTER — Telehealth: Payer: Self-pay

## 2013-02-05 NOTE — Telephone Encounter (Signed)
Greg Wallace left v/m requesting creatinine test results when available.spoke with Greg Wallace and advised Dr [name] spoke with pt about lab results when pt was in office on 01/18/13. Pt requested creatinine results 1.09 was given from 01/16/13 labs. Greg Wallace will ck with pt about wellness form and call back if needed.

## 2013-02-11 ENCOUNTER — Telehealth: Payer: Self-pay

## 2013-02-11 NOTE — Telephone Encounter (Signed)
Greg Wallace requested copy of nicotine/cotinine test for pts wellness form. Greg Wallace will pick up copy at front desk.

## 2013-03-29 ENCOUNTER — Other Ambulatory Visit: Payer: Self-pay | Admitting: Family Medicine

## 2013-08-06 IMAGING — US US ABDOMEN COMPLETE
1 series · 13 of 25 positions shown · non-contrast
Comparison: None.

CLINICAL DATA: Epigastric abdominal pain.

COMPLETE ABDOMINAL ULTRASOUND 04/12/2011:

[Series 1: us abdomen complete · 0.32mm/px · 13 of 88 slices shown]
[im 1/88]
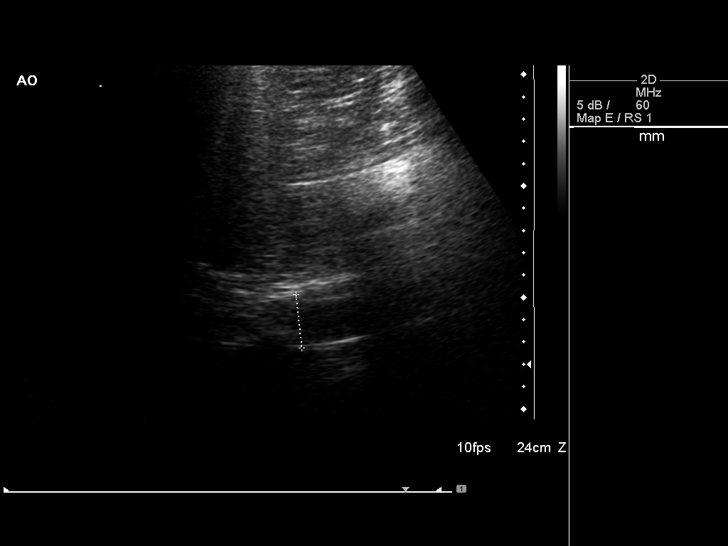
[im 8/88]
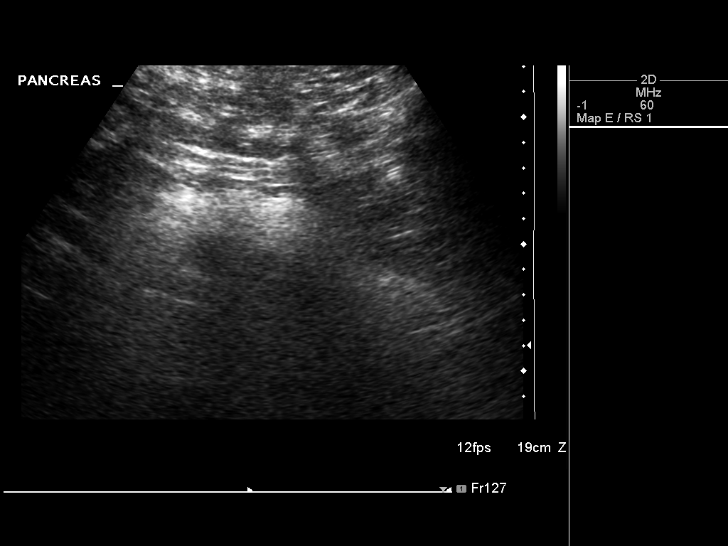
[im 15/88]
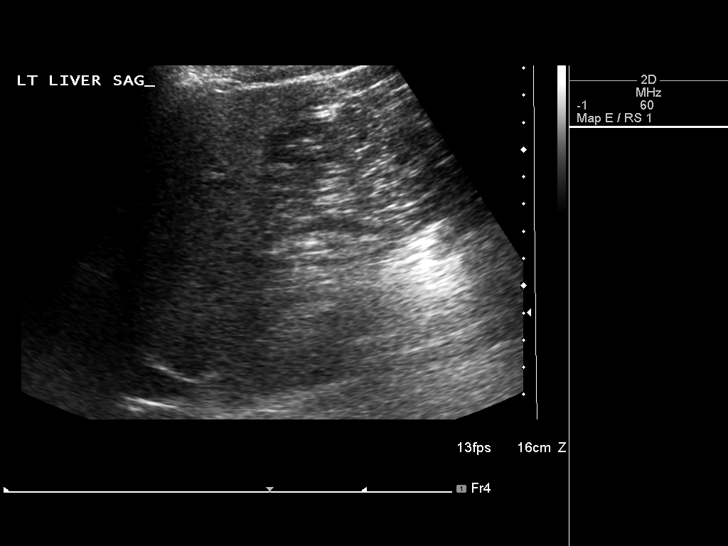
[im 22/88]
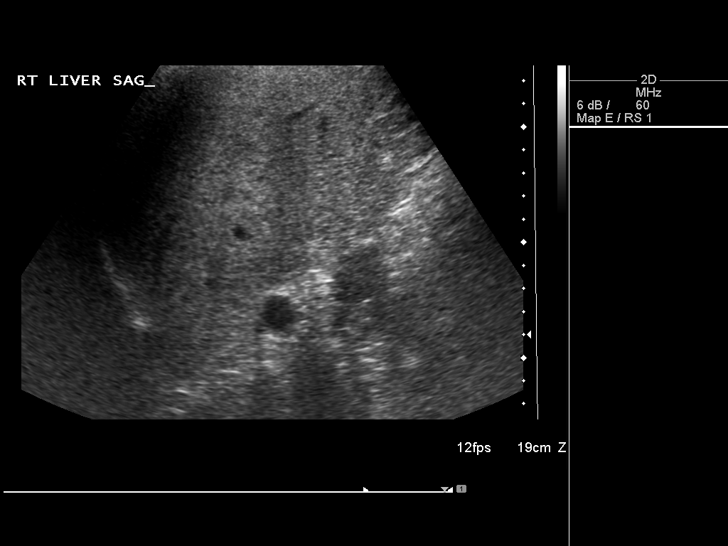
[im 30/88]
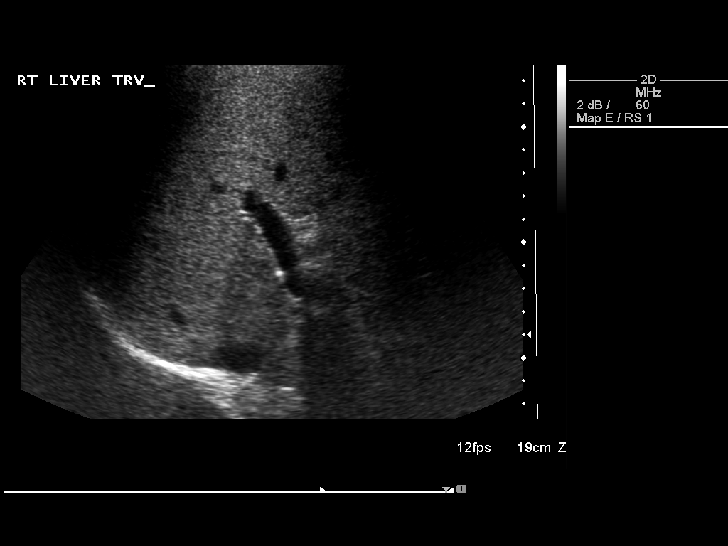
[im 37/88]
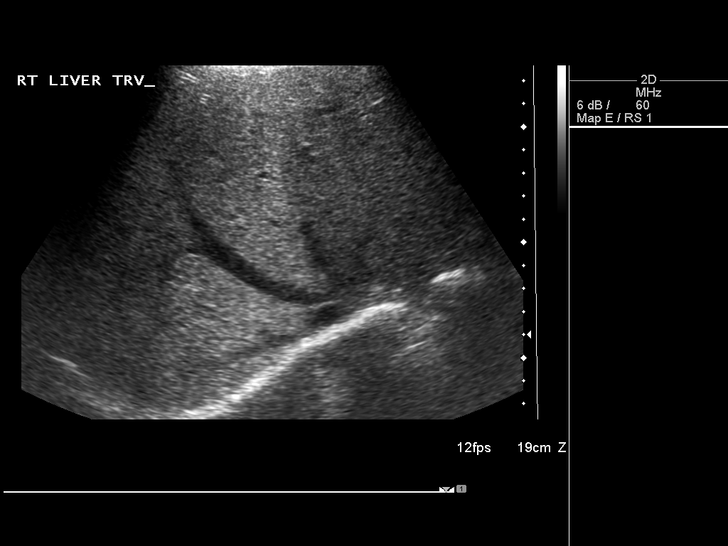
[im 44/88]
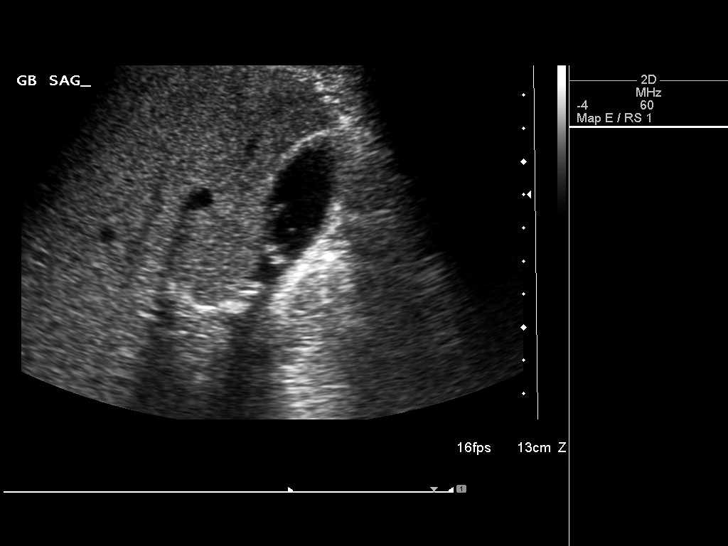
[im 51/88]
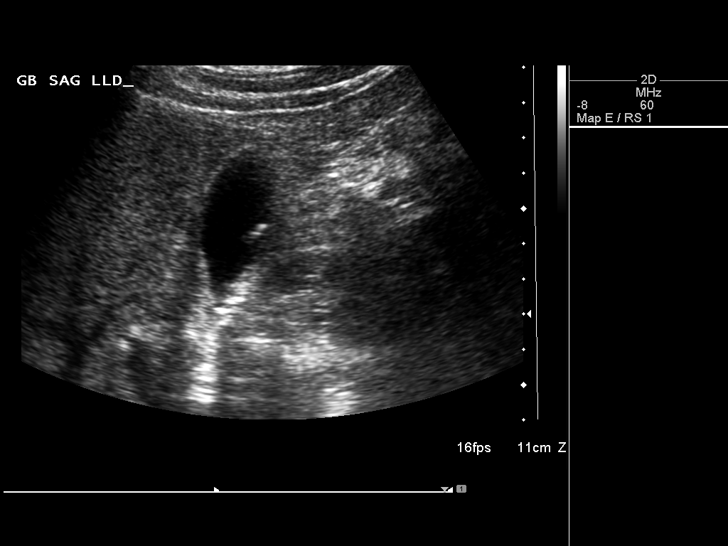
[im 59/88]
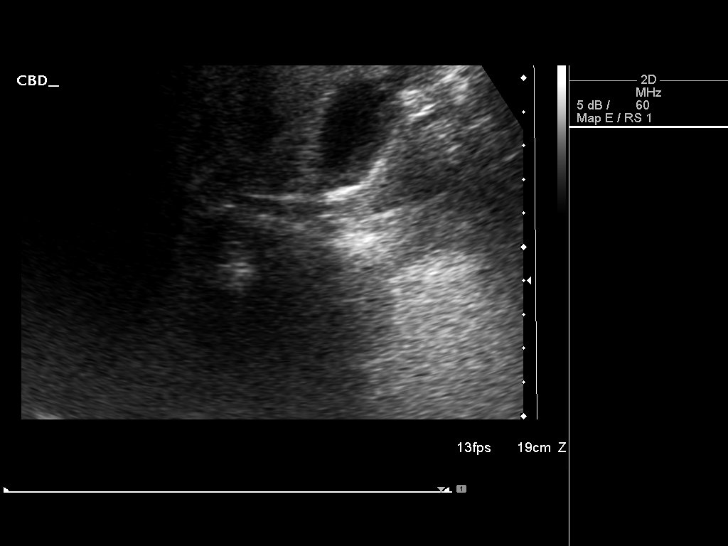
[im 66/88]
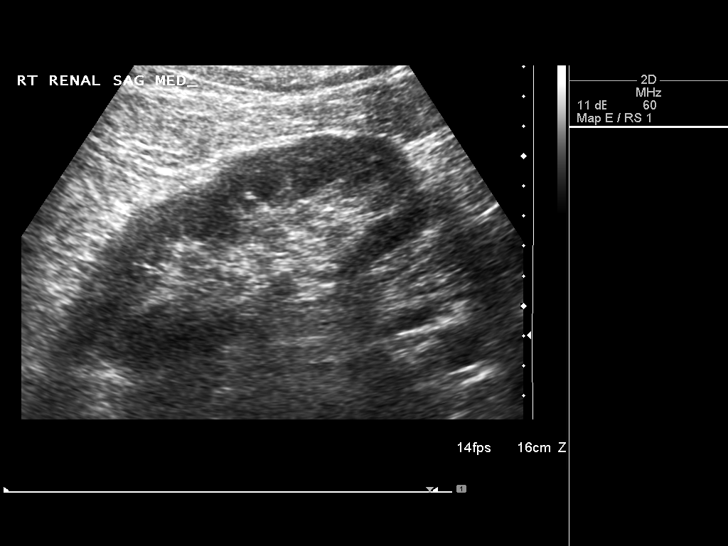
[im 73/88]
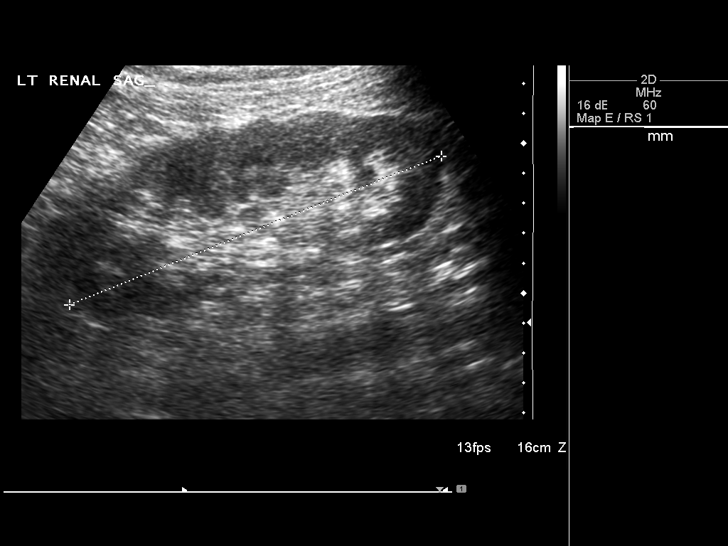
[im 80/88]
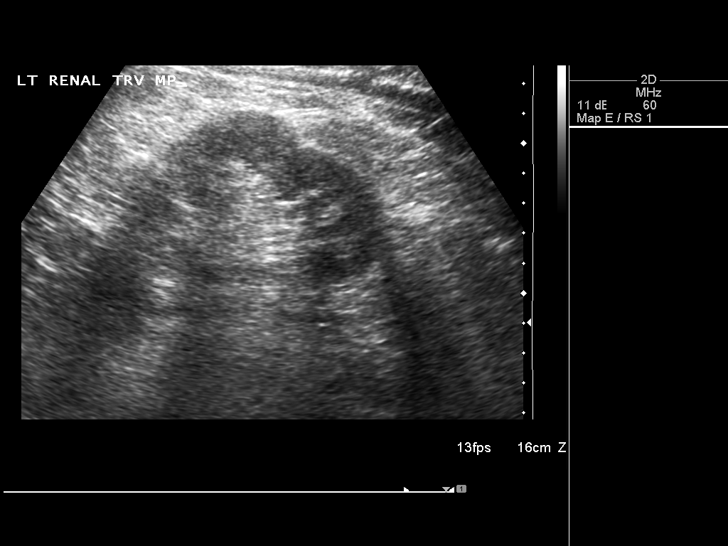
[im 88/88]
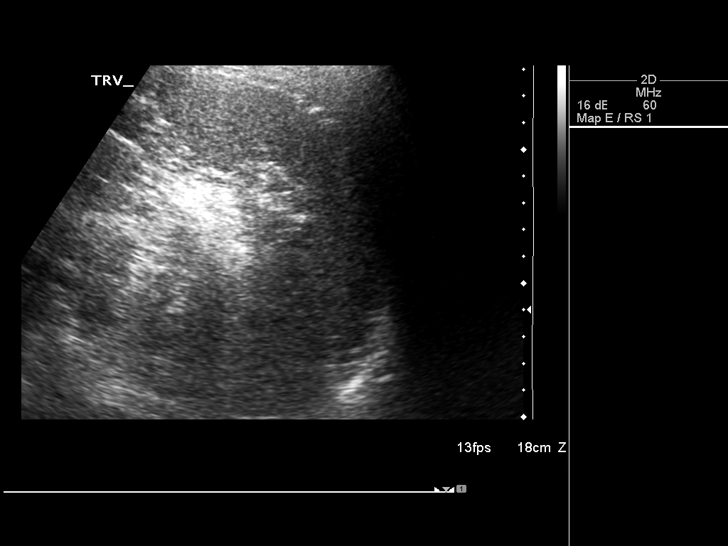

[13 of 25 positions shown; findings below may reference images not displayed]

FINDINGS: Gallbladder:  Multiple mobile, shadowing gallstones, the largest on
the order of approximately 12 mm.  No gallbladder wall thickening
or pericholecystic fluid.  Negative sonographic Murphy's sign
according to the ultrasound technologist.

Common bile duct:  Normal in caliber with maximum diameter
approximating 5 mm.

Liver:  Diffusely increased and coarsened echotexture without focal
hepatic parenchymal abnormality.  Patent portal vein.

IVC:  Patent.

Pancreas:  Although the pancreas is difficult to visualize in its
entirety, no focal pancreatic abnormality is identified.

Spleen:  Normal size and echotexture without focal parenchymal
abnormality.

Right Kidney:  No hydronephrosis.  Well-preserved cortex.  No
shadowing calculi.  Normal size and parenchymal echotexture without
focal abnormalities.  Approximately 12.8 cm in length.

Left Kidney:  No hydronephrosis.  Well-preserved cortex.  No
shadowing calculi.  Normal size and parenchymal echotexture without
focal abnormalities.  Approximately 13.4 cm in length.

Abdominal aorta:  Normal in caliber throughout its visualized
course in the abdomen without significant atherosclerosis.
IMPRESSION: 1.  Cholelithiasis without sonographic evidence of acute
cholecystitis.  No biliary ductal dilation.
2.  Otherwise normal examination.

## 2013-09-10 IMAGING — CR DG CHEST 2V
2 series · 2 of 2 positions shown · non-contrast
Comparison: 03/28/2010

CLINICAL DATA: Preop for laparoscopic cholecystectomy.
Hypertension.

CHEST - 2 VIEW

[w chest pa]
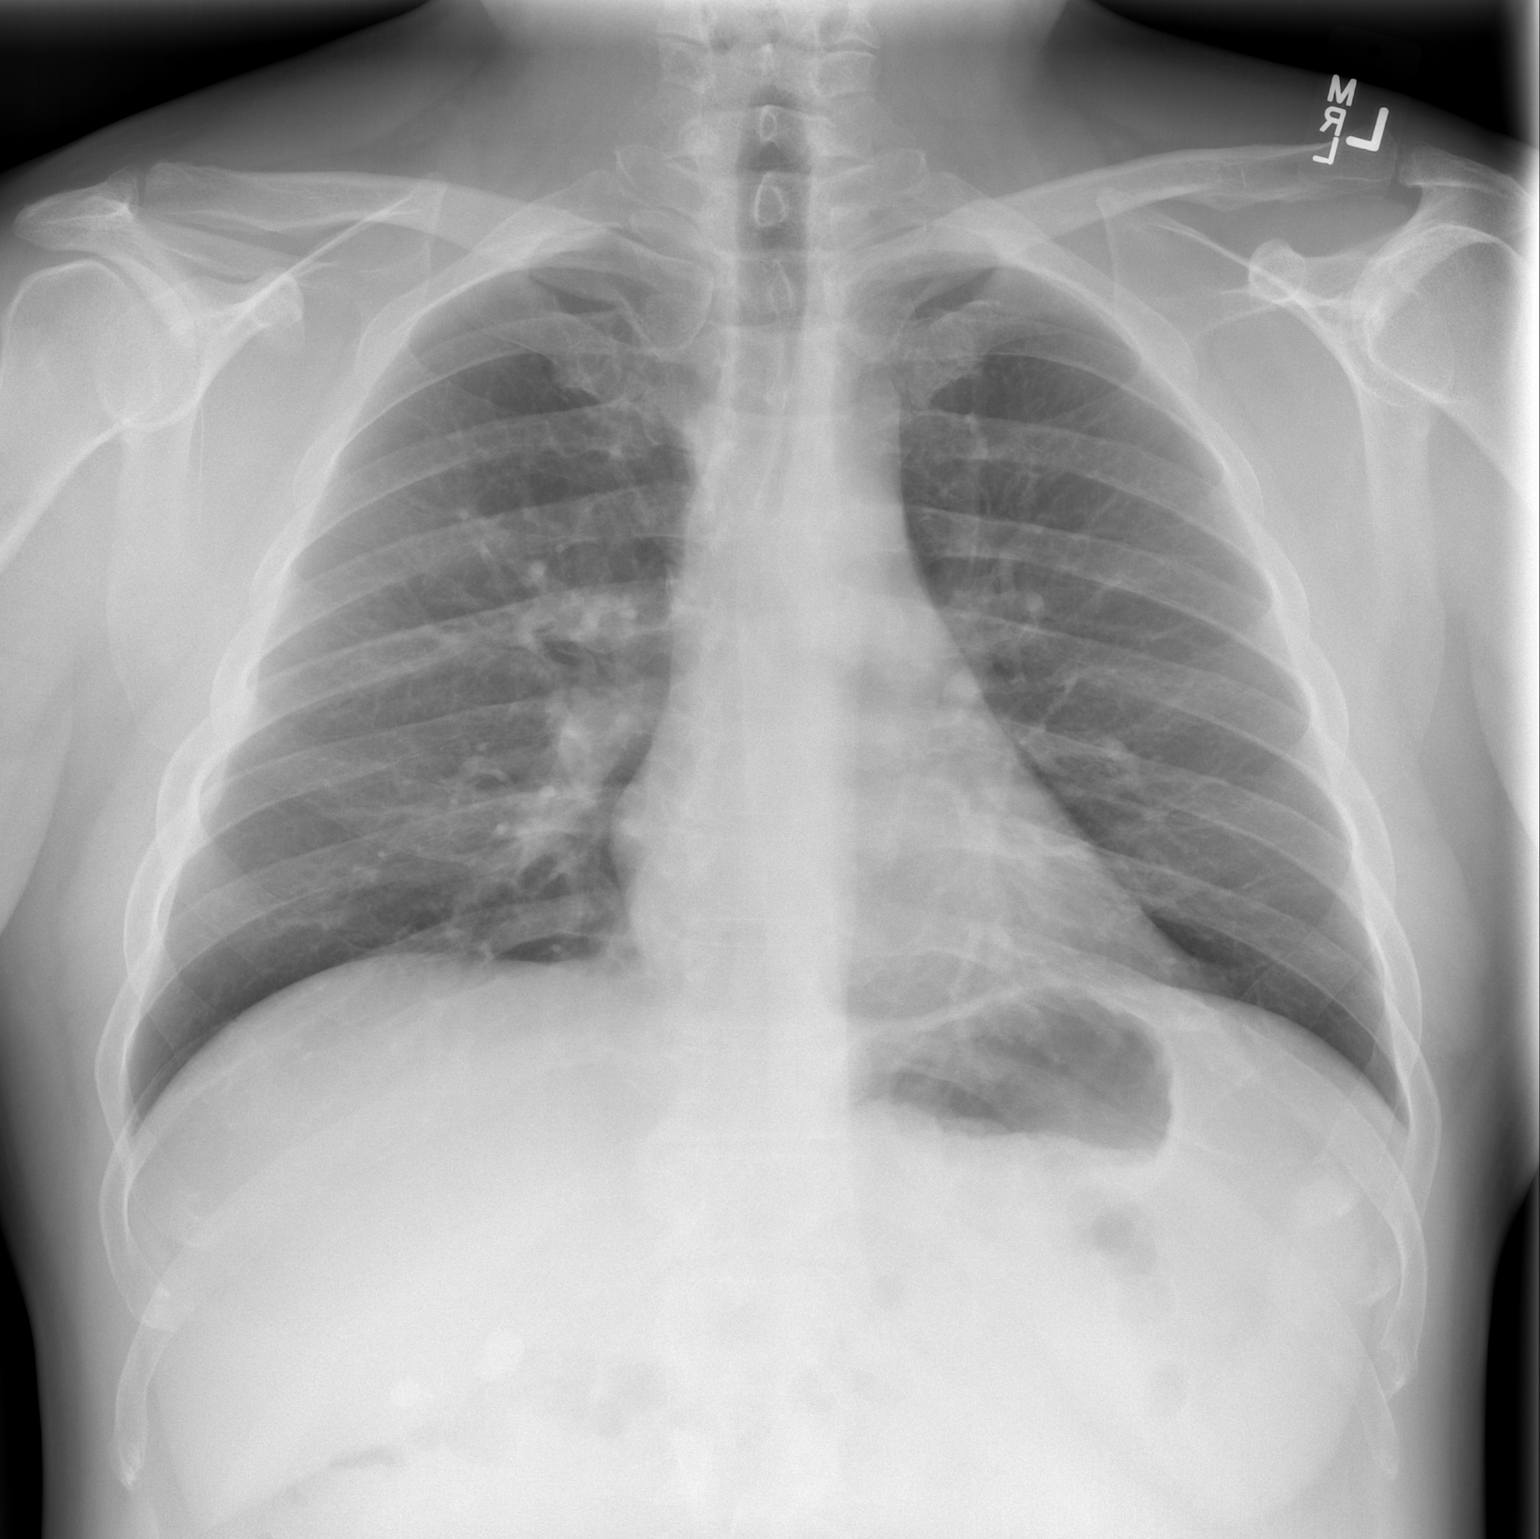

[w chest lat]
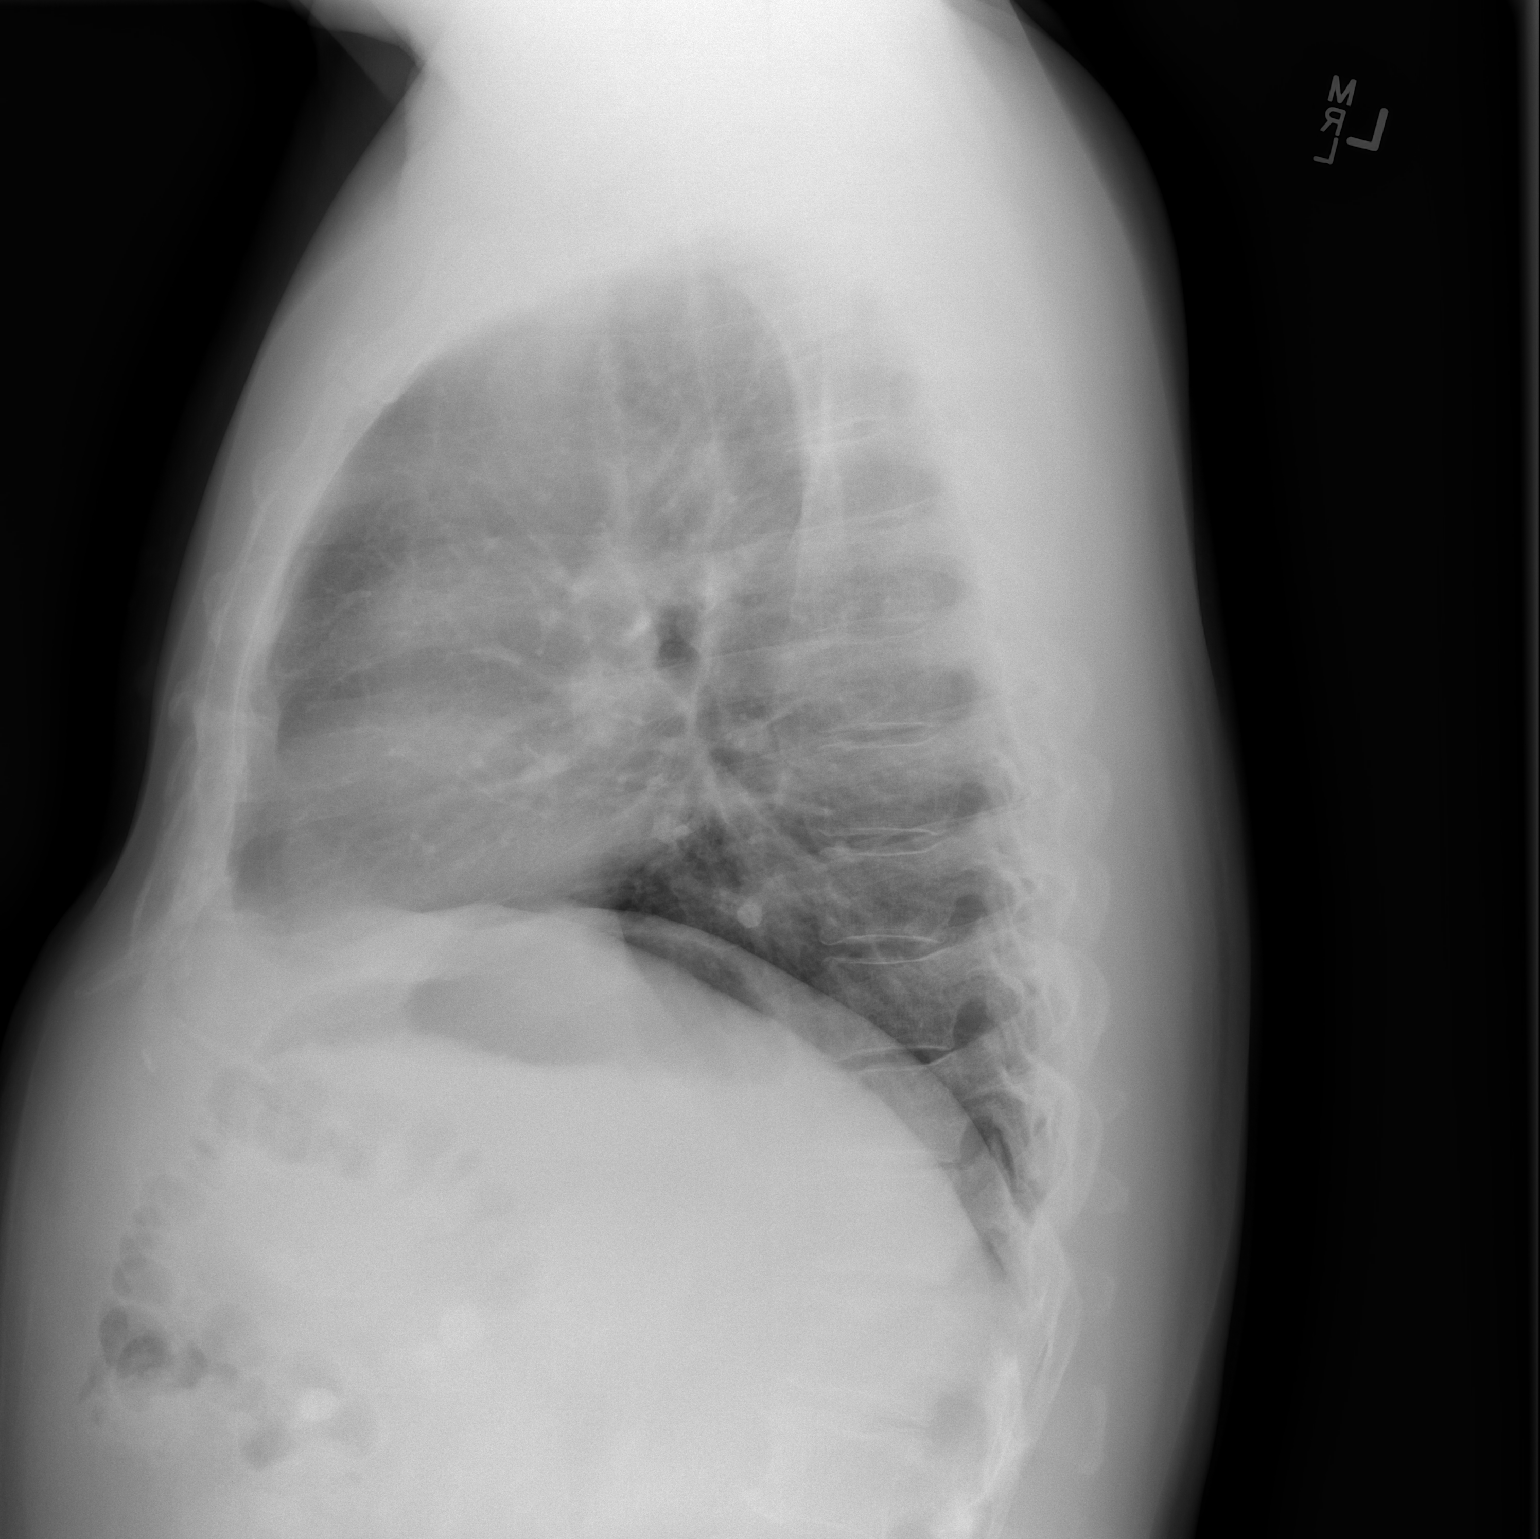

[2 of 2 positions shown; findings below may reference images not displayed]

FINDINGS: The heart, mediastinal, and hilar contours are within
normal limits and stable.  The lungs are clear.  There is no
pleural effusion or pneumothorax.  Rounded calcifications
projecting over the right upper quadrant are consistent with the
patient's known gallstones.  Slight degenerative changes of the
visualized spine.  No acute bony abnormality.
IMPRESSION: 1.  No acute cardiopulmonary disease.
2.  Cholelithiasis.

## 2013-09-13 IMAGING — RF DG CHOLANGIOGRAM OPERATIVE
1 series · 4 of 4 positions shown · non-contrast
Comparison: None

CLINICAL DATA: Cholelithiasis

INTRAOPERATIVE CHOLANGIOGRAM
TECHNIQUE: Cholangiographic images from the C-arm fluoroscopic
device were submitted for interpretation post-operatively.  Please
see the procedural report for the amount of contrast and the
fluoroscopy time utilized.

[Series 1: run · 4 of 57 frames shown]
[frame 9/57]
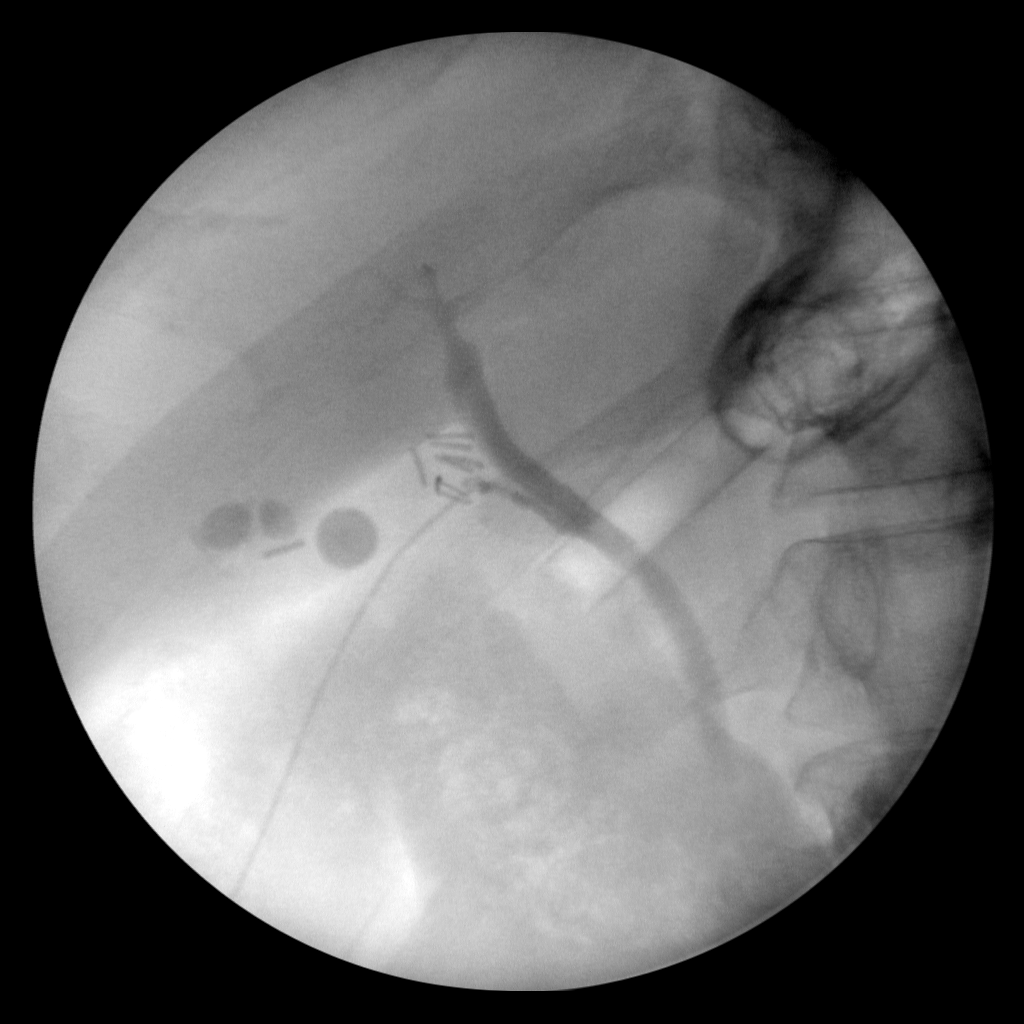
[frame 29/57]
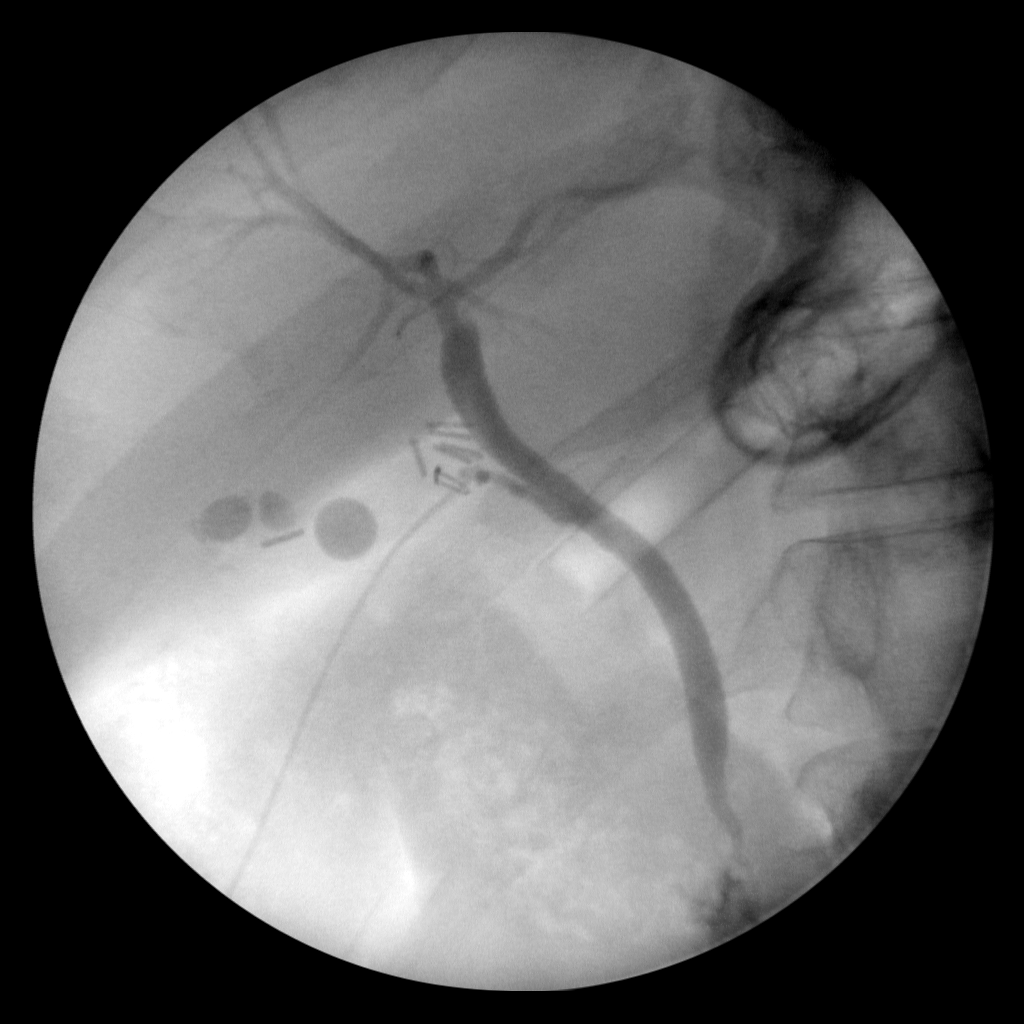
[frame 49/57]
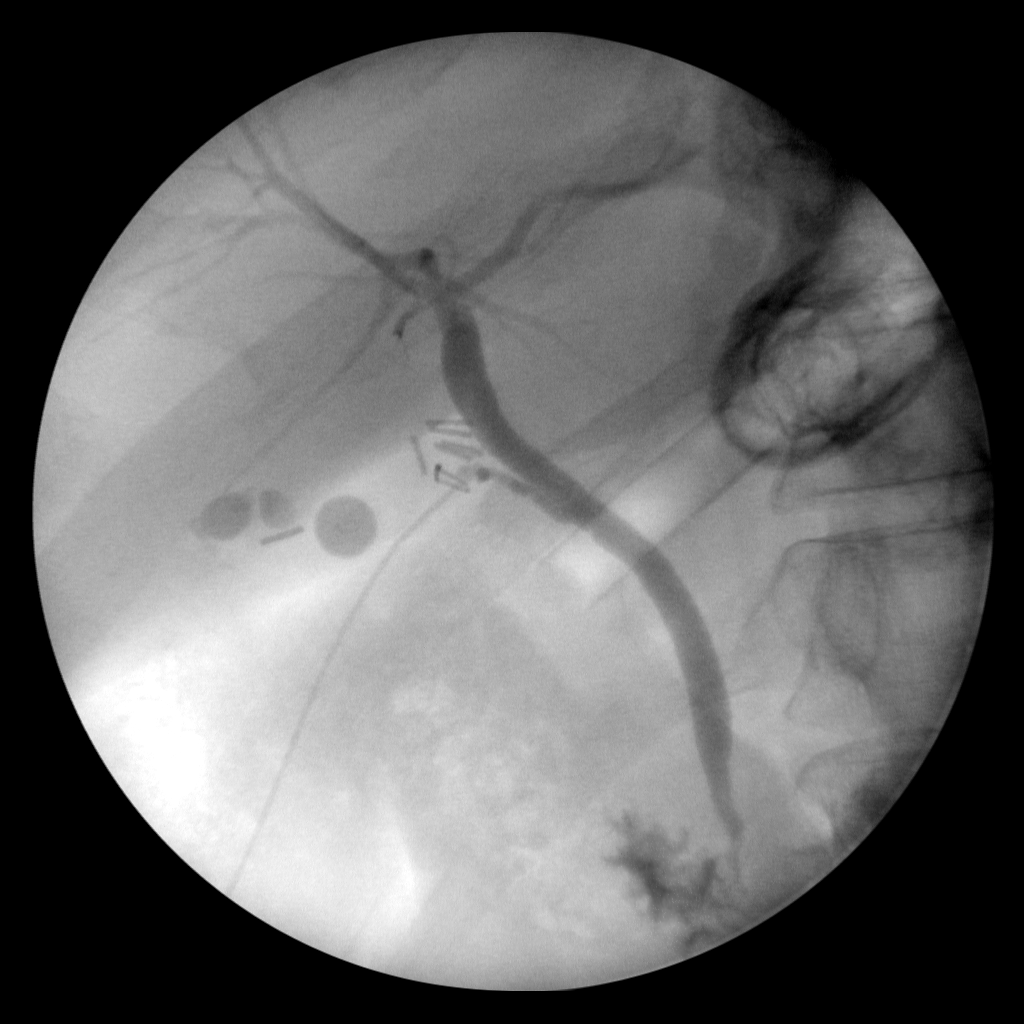
[frame 50/57]
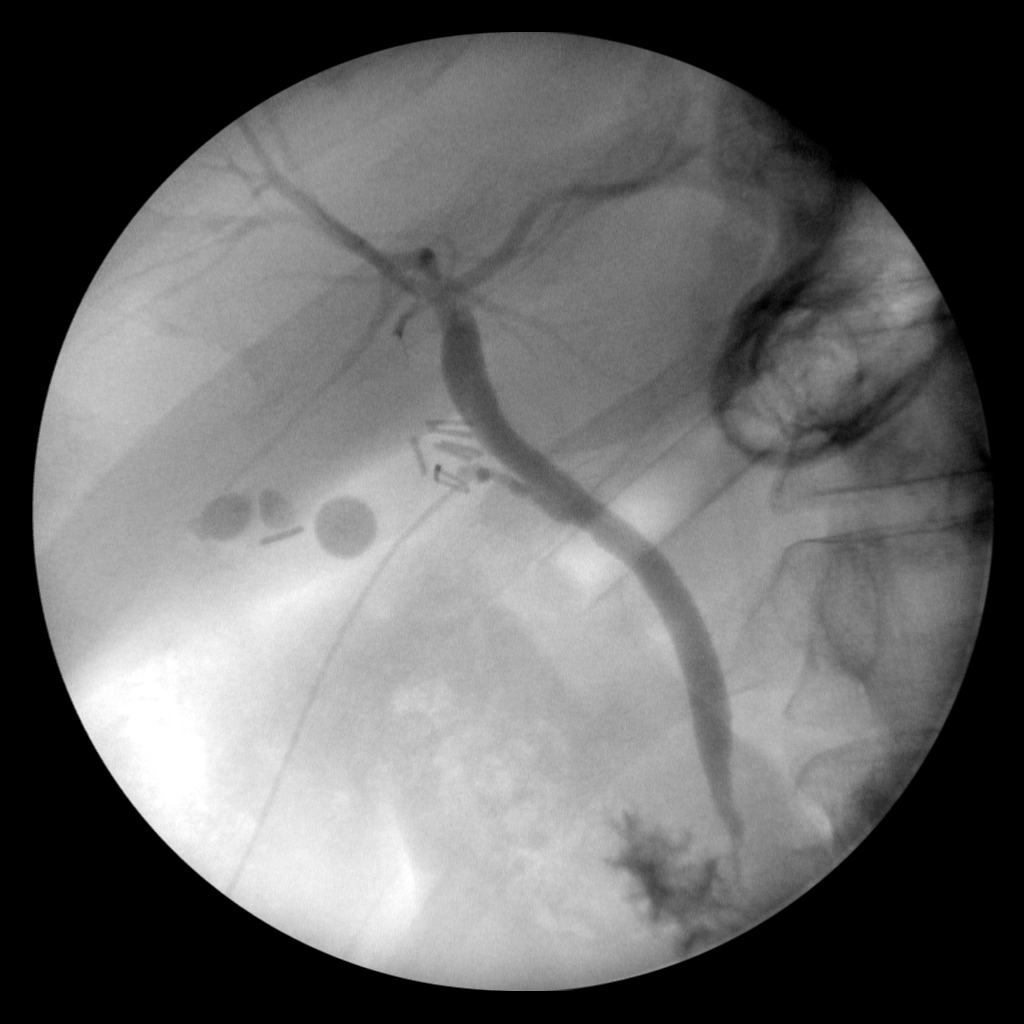

[4 of 4 positions shown; findings below may reference images not displayed]

FINDINGS: No persistent filling defects in the common duct.
Intrahepatic ducts are incompletely visualized, appearing
decompressed centrally. Contrast passes into the duodenum.

IMPRESSION

Negative for retained common duct stone.

## 2013-12-30 ENCOUNTER — Other Ambulatory Visit: Payer: Self-pay | Admitting: Family Medicine

## 2014-06-10 ENCOUNTER — Other Ambulatory Visit: Payer: Self-pay | Admitting: Family Medicine

## 2015-12-25 ENCOUNTER — Encounter: Payer: Self-pay | Admitting: Family Medicine

## 2015-12-25 ENCOUNTER — Ambulatory Visit (INDEPENDENT_AMBULATORY_CARE_PROVIDER_SITE_OTHER): Payer: 59 | Admitting: Family Medicine

## 2015-12-25 VITALS — BP 148/100 | HR 90 | Temp 98.2°F | Wt 220.8 lb

## 2015-12-25 DIAGNOSIS — R7989 Other specified abnormal findings of blood chemistry: Secondary | ICD-10-CM | POA: Diagnosis not present

## 2015-12-25 DIAGNOSIS — E785 Hyperlipidemia, unspecified: Secondary | ICD-10-CM

## 2015-12-25 DIAGNOSIS — I10 Essential (primary) hypertension: Secondary | ICD-10-CM

## 2015-12-25 DIAGNOSIS — Z125 Encounter for screening for malignant neoplasm of prostate: Secondary | ICD-10-CM

## 2015-12-25 DIAGNOSIS — R7303 Prediabetes: Secondary | ICD-10-CM | POA: Diagnosis not present

## 2015-12-25 LAB — BASIC METABOLIC PANEL
BUN: 17 mg/dL (ref 6–23)
CALCIUM: 10.1 mg/dL (ref 8.4–10.5)
CO2: 30 meq/L (ref 19–32)
Chloride: 101 mEq/L (ref 96–112)
Creatinine, Ser: 1.19 mg/dL (ref 0.40–1.50)
GFR: 66.94 mL/min (ref >60.00)
Glucose, Bld: 98 mg/dL (ref 70–99)
POTASSIUM: 3.9 meq/L (ref 3.5–5.1)
Sodium: 139 mEq/L (ref 135–145)

## 2015-12-25 LAB — LIPID PANEL
Cholesterol: 200 mg/dL (ref 0–200)
HDL: 38.5 mg/dL — ABNORMAL LOW (ref >39.00)
NonHDL: 161.11
Total CHOL/HDL Ratio: 5
Triglycerides: 240 mg/dL — ABNORMAL HIGH (ref 0.0–149.0)
VLDL: 48 mg/dL — ABNORMAL HIGH (ref 0.0–40.0)

## 2015-12-25 LAB — PSA: PSA: 6.92 ng/mL — ABNORMAL HIGH (ref 0.10–4.00)

## 2015-12-25 LAB — HEMOGLOBIN A1C: Hgb A1c MFr Bld: 5.7 % (ref 4.6–6.5)

## 2015-12-25 LAB — LDL CHOLESTEROL, DIRECT: Direct LDL: 120 mg/dL

## 2015-12-25 MED ORDER — LISINOPRIL-HYDROCHLOROTHIAZIDE 20-12.5 MG PO TABS: 1.0000 | ORAL_TABLET | Freq: Every day | ORAL | Status: DC

## 2015-12-25 NOTE — Assessment & Plan Note (Signed)
Chronic, deteriorated off med. However even on med he endorses some elevated readings - will send in higher lisinopril dose in form of lisinopril hctz 20/12.5mg  daily. Recheck at CPE end of month.

## 2015-12-25 NOTE — Progress Notes (Signed)
Pre visit review using our clinic review tool, if applicable. No additional management support is needed unless otherwise documented below in the visit note. 

## 2015-12-25 NOTE — Patient Instructions (Addendum)
Start higher blood pressure medicine dose.  We will recheck at physical.  DASH Eating Plan DASH stands for "Dietary Approaches to Stop Hypertension." The DASH eating plan is a healthy eating plan that has been shown to reduce high blood pressure (hypertension). Additional health benefits may include reducing the risk of type 2 diabetes mellitus, heart disease, and stroke. The DASH eating plan may also help with weight loss. WHAT DO I NEED TO KNOW ABOUT THE DASH EATING PLAN? For the DASH eating plan, you will follow these general guidelines:  Choose foods with a percent daily value for sodium of less than 5% (as listed on the food label).  Use salt-free seasonings or herbs instead of table salt or sea salt.  Check with your health care provider or pharmacist before using salt substitutes.  Eat lower-sodium products, often labeled as "lower sodium" or "no salt added."  Eat fresh foods.  Eat more vegetables, fruits, and low-fat dairy products.  Choose whole grains. Look for the word "whole" as the first word in the ingredient list.  Choose fish and skinless chicken or [place] more often than red meat. Limit fish, poultry, and meat to 6 oz (170 g) each day.  Limit sweets, desserts, sugars, and sugary drinks.  Choose heart-healthy fats.  Limit cheese to 1 oz (28 g) per day.  Eat more home-cooked food and less restaurant, buffet, and fast food.  Limit fried foods.  Cook foods using methods other than frying.  Limit canned vegetables. If you do use them, rinse them well to decrease the sodium.  When eating at a restaurant, ask that your food be prepared with less salt, or no salt if possible. WHAT FOODS CAN I EAT? Seek help from a dietitian for individual calorie needs. Grains Whole grain or whole wheat bread. Brown rice. Whole grain or whole wheat pasta. Quinoa, bulgur, and whole grain cereals. Low-sodium cereals. Corn or whole wheat flour tortillas. Whole grain cornbread. Whole  grain crackers. Low-sodium crackers. Vegetables Fresh or frozen vegetables (raw, steamed, roasted, or grilled). Low-sodium or reduced-sodium tomato and vegetable juices. Low-sodium or reduced-sodium tomato sauce and paste. Low-sodium or reduced-sodium canned vegetables.  Fruits All fresh, canned (in natural juice), or frozen fruits. Meat and Other Protein Products Ground beef (85% or leaner), grass-fed beef, or beef trimmed of fat. Skinless chicken or [place]. Ground chicken or [place]. Pork trimmed of fat. All fish and seafood. Eggs. Dried beans, peas, or lentils. Unsalted nuts and seeds. Unsalted canned beans. Dairy Low-fat dairy products, such as skim or 1% milk, 2% or reduced-fat cheeses, low-fat ricotta or cottage cheese, or plain low-fat yogurt. Low-sodium or reduced-sodium cheeses. Fats and Oils Tub margarines without trans fats. Light or reduced-fat mayonnaise and salad dressings (reduced sodium). Avocado. Safflower, olive, or canola oils. Natural peanut or almond butter. Other Unsalted popcorn and pretzels. The items listed above may not be a complete list of recommended foods or beverages. Contact your dietitian for more options. WHAT FOODS ARE NOT RECOMMENDED? Grains White bread. White pasta. White rice. Refined cornbread. Bagels and croissants. Crackers that contain trans fat. Vegetables Creamed or fried vegetables. Vegetables in a cheese sauce. Regular canned vegetables. Regular canned tomato sauce and paste. Regular tomato and vegetable juices. Fruits Dried fruits. Canned fruit in light or heavy syrup. Fruit juice. Meat and Other Protein Products Fatty cuts of meat. Ribs, chicken wings, bacon, sausage, bologna, salami, chitterlings, fatback, hot dogs, bratwurst, and packaged luncheon meats. Salted nuts and seeds. Canned beans with salt. Dairy Whole  or 2% milk, cream, half-and-half, and cream cheese. Whole-fat or sweetened yogurt. Full-fat cheeses or blue cheese. Nondairy creamers  and whipped toppings. Processed cheese, cheese spreads, or cheese curds. Condiments Onion and garlic salt, seasoned salt, table salt, and sea salt. Canned and packaged gravies. Worcestershire sauce. Tartar sauce. Barbecue sauce. Teriyaki sauce. Soy sauce, including reduced sodium. Steak sauce. Fish sauce. Oyster sauce. Cocktail sauce. Horseradish. Ketchup and mustard. Meat flavorings and tenderizers. Bouillon cubes. Hot sauce. Tabasco sauce. Marinades. Taco seasonings. Relishes. Fats and Oils Butter, stick margarine, lard, shortening, ghee, and bacon fat. Coconut, palm kernel, or palm oils. Regular salad dressings. Other Pickles and olives. Salted popcorn and pretzels. The items listed above may not be a complete list of foods and beverages to avoid. Contact your dietitian for more information. WHERE CAN I FIND MORE INFORMATION? National Heart, Lung, and Blood Institute: travelstabloid.com   This information is not intended to replace advice given to you by your health care provider. Make sure you discuss any questions you have with your health care provider.   Document Released: 05/26/2011 Document Revised: 06/27/2014 Document Reviewed: 04/10/2013 Elsevier Interactive Patient Education [employer].

## 2015-12-25 NOTE — Progress Notes (Signed)
BP 148/100 mmHg  Pulse 90  Temp(Src) 98.2 F (36.8 C) (Oral)  Wt 220 lb 12 oz (100.132 kg)  SpO2 97%   CC: med refill visit  Subjective:    Patient ID: Greg Wallace, male    DOB: 1960/10/25, 55 y.o.   MRN: [id number]  HPI: Greg Wallace is a 55 y.o. male presenting on 12/25/2015 for Hypertension and Medication Refill   Last seen 2014.  HTN - Ran out of medications a few weeks ago. Does check blood pressures at home: 130s/90s on med. No low blood pressure readings or symptoms of dizziness/syncope. Denies vision changes, CP/tightness, SOB, leg swelling. Occasional headache.  Relevant past medical, surgical, family and social history reviewed and updated as indicated. Interim medical history since our last visit reviewed. Allergies and medications reviewed and updated. Current Outpatient Prescriptions on File Prior to Visit  Medication Sig  . Multiple Vitamins-Minerals (MULTIVITAMIN PO) Take 1 tablet by mouth daily.  . sildenafil (VIAGRA) 50 MG tablet Take 1 tablet (50 mg total) by mouth daily as needed for erectile dysfunction. (Patient not taking: Reported on 12/25/2015)   No current facility-administered medications on file prior to visit.    Review of Systems Per HPI unless specifically indicated in ROS section     Objective:    BP 148/100 mmHg  Pulse 90  Temp(Src) 98.2 F (36.8 C) (Oral)  Wt 220 lb 12 oz (100.132 kg)  SpO2 97%  Wt Readings from Last 3 Encounters:  12/25/15 220 lb 12 oz (100.132 kg)  01/18/13 220 lb 12 oz (100.132 kg)  03/06/12 219 lb 12 oz (99.678 kg)   Body mass index is 32.58 kg/(m^2).  Physical Exam  Constitutional: He appears well-developed and well-nourished. No distress.  HENT:  Mouth/Throat: Oropharynx is clear and moist. No oropharyngeal exudate.  Mild residual R facial weakness  Cardiovascular: Normal rate, regular rhythm, normal heart sounds and intact distal pulses.   No murmur heard. Pulmonary/Chest: Effort normal and breath sounds  normal. No respiratory distress. He has no wheezes. He has no rales.  Musculoskeletal: He exhibits no edema.  Psychiatric: He has a normal mood and affect.  Nursing note and vitals reviewed.  Results for orders placed or performed in visit on 01/16/13  Lipid panel  Result Value Ref Range   Cholesterol, Total 173 100 - 199 mg/dL   Triglycerides 829353 (H) 0 - 149 mg/dL   HDL 30 (L) >56>39 mg/dL   VLDL Cholesterol Cal 71 (H) 5 - 40 mg/dL   LDL Calculated 72 0 - 99 mg/dL   Chol/HDL Ratio 5.8 (H) 0.0 - 5.0 ratio units  Comprehensive metabolic panel  Result Value Ref Range   Glucose 93 65 - 99 mg/dL   BUN 16 6 - 24 mg/dL   Creatinine, Ser 2.131.09 0.76 - 1.27 mg/dL   GFR calc non Af Amer 77 >59 mL/min/1.73   GFR calc Af Amer 88 >59 mL/min/1.73   BUN/Creatinine Ratio 15 9 - 20   Sodium 142 134 - 144 mmol/L   Potassium 4.4 3.5 - 5.2 mmol/L   Chloride 102 97 - 108 mmol/L   CO2 28 18 - 29 mmol/L   Calcium 9.6 8.7 - 10.2 mg/dL   Total Protein 6.7 6.0 - 8.5 g/dL   Albumin 4.4 3.5 - 5.5 g/dL   Globulin, Total 2.3 1.5 - 4.5 g/dL   Albumin/Globulin Ratio 1.9 1.1 - 2.5   Total Bilirubin 0.9 0.0 - 1.2 mg/dL   Alkaline Phosphatase 70  39 - 117 IU/L   AST 27 0 - 40 IU/L   ALT 42 0 - 44 IU/L  Hemoglobin A1c  Result Value Ref Range   Hgb A1c MFr Bld 5.7 (H) 4.8 - 5.6 %   Est. average glucose Bld gHb Est-mCnc 117 mg/dL  Nicotine/cotinine metabolites  Result Value Ref Range   Nicotine None Detected ng/mL   Cotinine None Detected ng/mL      Assessment & Plan:   Problem List Items Addressed This Visit    Essential hypertension - Primary    Chronic, deteriorated off med. However even on med he endorses some elevated readings - will send in higher lisinopril dose in form of lisinopril hctz 20/12.5mg  daily. Recheck at CPE end of month.      Relevant Medications   lisinopril-hydrochlorothiazide (ZESTORETIC) 20-12.5 MG tablet   Other Relevant Orders   Basic metabolic panel   Microalbumin / creatinine  urine ratio   HLD (hyperlipidemia)   Relevant Medications   lisinopril-hydrochlorothiazide (ZESTORETIC) 20-12.5 MG tablet   Other Relevant Orders   Lipid panel   Prediabetes   Relevant Orders   Hemoglobin A1c    Other Visit Diagnoses    Special screening for malignant neoplasm of prostate        Relevant Orders    PSA        Follow up plan: Return as needed.  Eustaquio BoydenJavier Makensey Rego, MD

## 2015-12-28 ENCOUNTER — Telehealth: Payer: Self-pay | Admitting: *Deleted

## 2015-12-28 DIAGNOSIS — R972 Elevated prostate specific antigen [PSA]: Secondary | ICD-10-CM

## 2015-12-28 NOTE — Telephone Encounter (Signed)
Lab results given to patient this morning. Patient called back stating that he has had time to think about his lab results and is concerned about the elevated PSA. Patient stated that he does not know if he wants to wait until his appointment to follow-up on this. Patient wants to know what all can cause an elevated PSA besides cancer?

## 2015-12-29 DIAGNOSIS — R972 Elevated prostate specific antigen [PSA]: Secondary | ICD-10-CM | POA: Insufficient documentation

## 2015-12-29 NOTE — Addendum Note (Signed)
Addended by: Alvina ChouWALSH, TERRI J on: 12/29/2015 02:51 PM   Modules accepted: Orders

## 2015-12-29 NOTE — Telephone Encounter (Signed)
Spoke with patient. Discussed next step is check DRE at CPE 2 wks. Will have him return next week for free/total PSA

## 2016-01-11 ENCOUNTER — Other Ambulatory Visit (INDEPENDENT_AMBULATORY_CARE_PROVIDER_SITE_OTHER): Payer: 59

## 2016-01-11 DIAGNOSIS — R972 Elevated prostate specific antigen [PSA]: Secondary | ICD-10-CM

## 2016-01-11 DIAGNOSIS — R7303 Prediabetes: Secondary | ICD-10-CM

## 2016-01-11 DIAGNOSIS — I10 Essential (primary) hypertension: Secondary | ICD-10-CM

## 2016-01-11 NOTE — Addendum Note (Signed)
Addended by: Alvina Chou on: 01/11/2016 11:31 AM   Modules accepted: Orders

## 2016-01-12 ENCOUNTER — Ambulatory Visit (INDEPENDENT_AMBULATORY_CARE_PROVIDER_SITE_OTHER): Payer: 59 | Admitting: Family Medicine

## 2016-01-12 ENCOUNTER — Encounter: Payer: Self-pay | Admitting: Family Medicine

## 2016-01-12 VITALS — BP 132/84 | HR 74 | Temp 98.1°F | Ht 69.0 in | Wt 223.8 lb

## 2016-01-12 DIAGNOSIS — R972 Elevated prostate specific antigen [PSA]: Secondary | ICD-10-CM

## 2016-01-12 DIAGNOSIS — N401 Enlarged prostate with lower urinary tract symptoms: Secondary | ICD-10-CM

## 2016-01-12 DIAGNOSIS — E785 Hyperlipidemia, unspecified: Secondary | ICD-10-CM

## 2016-01-12 DIAGNOSIS — Z0000 Encounter for general adult medical examination without abnormal findings: Secondary | ICD-10-CM | POA: Diagnosis not present

## 2016-01-12 DIAGNOSIS — I10 Essential (primary) hypertension: Secondary | ICD-10-CM | POA: Diagnosis not present

## 2016-01-12 DIAGNOSIS — Z1211 Encounter for screening for malignant neoplasm of colon: Secondary | ICD-10-CM

## 2016-01-12 DIAGNOSIS — N138 Other obstructive and reflux uropathy: Secondary | ICD-10-CM

## 2016-01-12 DIAGNOSIS — R7303 Prediabetes: Secondary | ICD-10-CM

## 2016-01-12 LAB — FPSA% REFLEX
% FREE PSA: 14.4 %
PSA, FREE: 0.69 ng/mL

## 2016-01-12 LAB — MICROALBUMIN / CREATININE URINE RATIO
CREATININE, UR: 193.5 mg/dL
MICROALB/CREAT RATIO: 3 mg/g{creat} (ref 0.0–30.0)
MICROALBUM., U, RANDOM: 5.8 ug/mL

## 2016-01-12 LAB — PSA TOTAL (REFLEX TO FREE): Prostate Specific Ag, Serum: 4.8 ng/mL — ABNORMAL HIGH (ref 0.0–4.0)

## 2016-01-12 MED ORDER — TAMSULOSIN HCL 0.4 MG PO CAPS: 0.4000 mg | ORAL_CAPSULE | Freq: Every day | ORAL | 3 refills | Status: DC

## 2016-01-12 NOTE — Patient Instructions (Addendum)
Start flomax 0.85m daily for voiding symptoms. Pass by lab to pick up stool kit. Urinalysis today. We will refer you to urologist after your upcoming trip to [place]  Pass by our referral coordinators to set this up. Return as needed or in 1 year for next CPE   Return in 6 months for lab visit only  Health Maintenance, Male A healthy lifestyle and preventative care can promote health and wellness.  Maintain regular health, dental, and eye exams.  Eat a healthy diet. Foods like vegetables, fruits, whole grains, low-fat dairy products, and lean protein foods contain the nutrients you need and are low in calories. Decrease your intake of foods high in solid fats, added sugars, and salt. Get information about a proper diet from your health care provider, if necessary.  Regular physical exercise is one of the most important things you can do for your health. Most adults should get at least 150 minutes of moderate-intensity exercise (any activity that increases your heart rate and causes you to sweat) each week. In addition, most adults need muscle-strengthening exercises on 2 or more days a week.   Maintain a healthy weight. The body mass index (BMI) is a screening tool to identify possible weight problems. It provides an estimate of body fat based on height and weight. Your health care provider can find your BMI and can help you achieve or maintain a healthy weight. For males 20 years and older:  A BMI below 18.5 is considered underweight.  A BMI of 18.5 to 24.9 is normal.  A BMI of 25 to 29.9 is considered overweight.  A BMI of 30 and above is considered obese.  Maintain normal blood lipids and cholesterol by exercising and minimizing your intake of saturated fat. Eat a balanced diet with plenty of fruits and vegetables. Blood tests for lipids and cholesterol should begin at age 674and be repeated every 5 years. If your lipid or cholesterol levels are high, you are over age 57 or you  are at high risk for heart disease, you may need your cholesterol levels checked more frequently.Ongoing high lipid and cholesterol levels should be treated with medicines if diet and exercise are not working.  If you smoke, find out from your health care provider how to quit. If you do not use tobacco, do not start.  Lung cancer screening is recommended for adults aged 568-80years who are at high risk for developing lung cancer because of a history of smoking. A yearly low-dose CT scan of the lungs is recommended for people who have at least a 30-pack-year history of smoking and are current smokers or have quit within the past 15 years. A pack year of smoking is smoking an average of 1 pack of cigarettes a day for 1 year (for example, a 30-pack-year history of smoking could mean smoking 1 pack a day for 30 years or 2 packs a day for 15 years). Yearly screening should continue until the smoker has stopped smoking for at least 15 years. Yearly screening should be stopped for people who develop a health problem that would prevent them from having lung cancer treatment.  If you choose to drink alcohol, do not have more than 2 drinks per day. One drink is considered to be 12 oz (360 mL) of beer, 5 oz (150 mL) of wine, or 1.5 oz (45 mL) of liquor.  Avoid the use of street drugs. Do not share needles with anyone. Ask for help if you need  support or instructions about stopping the use of drugs.  High blood pressure causes heart disease and increases the risk of stroke. High blood pressure is more likely to develop in:  People who have blood pressure in the end of the normal range (100-139/85-89 mm Hg).  People who are overweight or obese.  People who are African American.  If you are 45-80 years of age, have your blood pressure checked every 3-5 years. If you are 5 years of age or older, have your blood pressure checked every year. You should have your blood pressure measured twice--once when you are at  a hospital or clinic, and once when you are not at a hospital or clinic. Record the average of the two measurements. To check your blood pressure when you are not at a hospital or clinic, you can use:  An automated blood pressure machine at a pharmacy.  A home blood pressure monitor.  If you are 108-24 years old, ask your health care provider if you should take aspirin to prevent heart disease.  Diabetes screening involves taking a blood sample to check your fasting blood sugar level. This should be done once every 3 years after age 55 if you are at a normal weight and without risk factors for diabetes. Testing should be considered at a younger age or be carried out more frequently if you are overweight and have at least 1 risk factor for diabetes.  Colorectal cancer can be detected and often prevented. Most routine colorectal cancer screening begins at the age of 87 and continues through age 18. However, your health care provider may recommend screening at an earlier age if you have risk factors for colon cancer. On a yearly basis, your health care provider may provide home test kits to check for hidden blood in the stool. A small camera at the end of a tube may be used to directly examine the colon (sigmoidoscopy or colonoscopy) to detect the earliest forms of colorectal cancer. Talk to your health care provider about this at age 107 when routine screening begins. A direct exam of the colon should be repeated every 5-10 years through age 109, unless early forms of precancerous polyps or small growths are found.  People who are at an increased risk for hepatitis B should be screened for this virus. You are considered at high risk for hepatitis B if:  You were born in a country where hepatitis B occurs often. Talk with your health care provider about which countries are considered high risk.  Your parents were born in a high-risk country and you have not received a shot to protect against hepatitis B  (hepatitis B vaccine).  You have HIV or AIDS.  You use needles to inject street drugs.  You live with, or have sex with, someone who has hepatitis B.  You are a man who has sex with other men (MSM).  You get hemodialysis treatment.  You take certain medicines for conditions like cancer, organ transplantation, and autoimmune conditions.  Hepatitis C blood testing is recommended for all people born from [birth year] through 1967 and any individual with known risk factors for hepatitis C.  Healthy men should no longer receive prostate-specific antigen (PSA) blood tests as part of routine cancer screening. Talk to your health care provider about prostate cancer screening.  Testicular cancer screening is not recommended for adolescents or adult males who have no symptoms. Screening includes self-exam, a health care provider exam, and other screening tests. Consult with your  health care provider about any symptoms you have or any concerns you have about testicular cancer.  Practice safe sex. Use condoms and avoid high-risk sexual practices to reduce the spread of sexually transmitted infections (STIs).  You should be screened for STIs, including gonorrhea and chlamydia if:  You are sexually active and are younger than 24 years.  You are older than 24 years, and your health care provider tells you that you are at risk for this type of infection.  Your sexual activity has changed since you were last screened, and you are at an increased risk for chlamydia or gonorrhea. Ask your health care provider if you are at risk.  If you are at risk of being infected with HIV, it is recommended that you take a prescription medicine daily to prevent HIV infection. This is called pre-exposure prophylaxis (PrEP). You are considered at risk if:  You are a man who has sex with other men (MSM).  You are a heterosexual man who is sexually active with multiple partners.  You take drugs by injection.  You are  sexually active with a partner who has HIV.  Talk with your health care provider about whether you are at high risk of being infected with HIV. If you choose to begin PrEP, you should first be tested for HIV. You should then be tested every 3 months for as long as you are taking PrEP.  Use sunscreen. Apply sunscreen liberally and repeatedly throughout the day. You should seek shade when your shadow is shorter than you. Protect yourself by wearing long sleeves, pants, a wide-brimmed hat, and sunglasses year round whenever you are outdoors.  Tell your health care provider of new moles or changes in moles, especially if there is a change in shape or color. Also, tell your health care provider if a mole is larger than the size of a pencil eraser.  A one-time screening for abdominal aortic aneurysm (AAA) and surgical repair of large AAAs by ultrasound is recommended for men aged 44-75 years who are current or former smokers.  Stay current with your vaccines (immunizations).   This information is not intended to replace advice given to you by your health care provider. Make sure you discuss any questions you have with your health care provider.   Document Released: 12/03/2007 Document Revised: 06/27/2014 Document Reviewed: 11/01/2010 Elsevier Interactive Patient Education [employer].

## 2016-01-12 NOTE — Assessment & Plan Note (Addendum)
Chronic, improved with new combo pill.  continue

## 2016-01-12 NOTE — Assessment & Plan Note (Signed)
Preventative protocols reviewed and updated unless pt declined. Discussed healthy diet and lifestyle.  

## 2016-01-12 NOTE — Progress Notes (Signed)
BP 132/84   Pulse 74   Temp 98.1 F (36.7 C) (Oral)   Ht  (1.753 m)   Wt 223 lb 12 oz (101.5 kg)   BMI 33.04 kg/m    CC: CPE Subjective:    Patient ID: Greg Wallace, male    DOB: [birth year], 55 y.o.   MRN: [id number]  HPI: Greg Wallace is a 55 y.o. male presenting on 01/12/2016 for Annual Exam   Seen earlier this month with HTN. bp better controlled with lisinopril hctz 20/12.5mg  daily. Checking at home daily averaging 134/82.   Noticing hesitancy with stream over last 1 year. Nocturia x1. Has been taking super beta prostate for last 2 months. Has been very stressed about elevated PSA.   Preventative: Prostate cancer screening - recent PSA elevated - see A&P section. Patient endorses nocturia x1-2, stream hesitancy and weakness.  Colon cancer screening - No BM changes, no blood in stool. iFOB - never returned last 2 years.  Declines flu shot.  Td 2009  Seat belt use discussed.  Sunscreen use discussed. no changing moles on skin.  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 Working on diet and has noted weight loss   Relevant past medical, surgical, family and social history reviewed and updated as indicated. Interim medical history since our last visit reviewed. Allergies and medications reviewed and updated. Current Outpatient Prescriptions on File Prior to Visit  Medication Sig  . lisinopril-hydrochlorothiazide (ZESTORETIC) 20-12.5 MG tablet Take 1 tablet by mouth daily.  . Multiple Vitamins-Minerals (MULTIVITAMIN PO) Take 1 tablet by mouth daily.  . sildenafil (VIAGRA) 50 MG tablet Take 1 tablet (50 mg total) by mouth daily as needed for erectile dysfunction. (Patient not taking: Reported on 01/12/2016)   No current facility-administered medications on file prior to visit.      Review of Systems  Constitutional: Negative for activity change, appetite change, chills, fatigue, fever and unexpected weight change.  HENT: Negative for hearing loss.   Eyes: Negative for visual disturbance.  Respiratory: Negative for cough, chest tightness, shortness of breath and wheezing.   Cardiovascular: Negative for chest pain, palpitations and leg swelling.  Gastrointestinal: Negative for abdominal distention, abdominal pain, blood in stool, constipation, diarrhea, nausea and vomiting.  Genitourinary: Negative for difficulty urinating and hematuria.  Musculoskeletal: Negative for arthralgias, myalgias and neck pain.  Skin: Negative for rash.  Neurological: Negative for dizziness, seizures, syncope and headaches (improved with better bp control).  Hematological: Negative for adenopathy. Does not bruise/bleed easily.  Psychiatric/Behavioral: Negative for dysphoric mood. The patient is not nervous/anxious.    Per HPI unless specifically indicated in ROS section     Objective:    BP 132/84   Pulse 74   Temp 98.1 F (36.7 C) (Oral)   Ht  (1.753 m)   Wt 223 lb 12 oz (101.5 kg)   BMI 33.04 kg/m   Wt Readings from Last 3 Encounters:  01/12/16 223 lb 12 oz (101.5 kg)  12/25/15 220 lb 12 oz (100.1 kg)  01/18/13 220 lb 12 oz (100.1 kg)    Physical Exam  Constitutional: He is oriented to person, place, and time. He appears well-developed and well-nourished. No distress.  HENT:  Head: Normocephalic and atraumatic.  Right Ear: Hearing, tympanic membrane, external ear and ear canal  normal.  Left Ear: Hearing, tympanic membrane, external ear and ear canal normal.  Nose: Nose normal.  Mouth/Throat: Uvula is midline, oropharynx is clear and moist and mucous membranes are normal. No oropharyngeal exudate, posterior oropharyngeal edema or posterior oropharyngeal erythema.  Eyes: Conjunctivae and EOM are normal. Pupils are equal, round, and reactive to light. No scleral  icterus.  Neck: Normal range of motion. Neck supple. No thyromegaly present.  Cardiovascular: Normal rate, regular rhythm, normal heart sounds and intact distal pulses.   No murmur heard. Pulses:      Radial pulses are 2+ on the right side, and 2+ on the left side.  Pulmonary/Chest: Effort normal and breath sounds normal. No respiratory distress. He has no wheezes. He has no rales.  Abdominal: Soft. Bowel sounds are normal. He exhibits no distension and no mass. There is no tenderness. There is no rebound and no guarding.  Genitourinary: Rectum normal. Rectal exam shows no external hemorrhoid, no internal hemorrhoid, no fissure, no mass, no tenderness and anal tone normal. Prostate is enlarged (30gm). Prostate is not tender.  Genitourinary Comments: No nodules appreciated  Musculoskeletal: Normal range of motion. He exhibits no edema.  Lymphadenopathy:    He has no cervical adenopathy.  Neurological: He is alert and oriented to person, place, and time.  CN grossly intact, station and gait intact  Skin: Skin is warm and dry. No rash noted.  Psychiatric: He has a normal mood and affect. His behavior is normal. Judgment and thought content normal.  Nursing note and vitals reviewed.  Results for orders placed or performed in visit on 01/11/16  PSA Total (Reflex To Free)  Result Value Ref Range   Prostate Specific Ag, Serum 4.8 (H) 0.0 - 4.0 ng/mL   Reflex Criteria Comment   Microalbumin / creatinine urine ratio  Result Value Ref Range   Creatinine, Urine 193.5 Not Estab. mg/dL   Microalbum.,U,Random 5.8 Not Estab. ug/mL   MICROALB/CREAT RATIO 3.0 0.0 - 30.0 mg/g creat  %fPSA Reflex  Result Value Ref Range   PSA, FREE 0.69 N/A ng/mL   % FREE PSA 14.4 %      Assessment & Plan:   Problem List Items Addressed This Visit    BPH with obstruction/lower urinary tract symptoms    DRE suggestive of this, also with LUTS. Start flomax. In setting of elevated PSA, will refer to urology for  further discussion but anticipate BPH related.       Relevant Medications   tamsulosin (FLOMAX) 0.4 MG CAPS capsule   Elevated PSA    Reviewed recent elevated PSA, reviewed repeat PSA and free % readings. Discussed isolated PSA readings vs PSA velocity. DRE today suggestive of BPH. Discussed options of serial monitoring vs referral to urology for further evaluation/discussion.  Pt has been very anxious and stressed since elevated PSA reading leading to trouble sleeping. He would be most reassured by referral to urology to discuss options. Referral placed.  He has planned trip to [place] early August with wife. He has not yet told his wife -about his elevated PSA wants to let her know after trip.      Relevant Orders   Ambulatory referral to Urology   Essential hypertension    Chronic, improved with new combo pill.  continue      Healthcare maintenance - Primary    Preventative protocols reviewed and updated unless pt declined. Discussed healthy diet and lifestyle.       HLD (hyperlipidemia)    Reviewed  FLP.  ASCVD 42yr risk = 10.2% Not currently interested in statin. Pt motivated to do healthy diet changes and has already started. Suggested return in 6 months to repeat FLP.       Prediabetes    Reviewed A1c. Encouraged avoiding added sugars.       Other Visit Diagnoses    Special screening for malignant neoplasms, colon       Relevant Orders   Fecal occult blood, imunochemical       Follow up plan: Return in about 1 year (around 01/11/2017) for annual exam, prior fasting for blood work.  Eustaquio Boyden, MD

## 2016-01-13 DIAGNOSIS — N401 Enlarged prostate with lower urinary tract symptoms: Secondary | ICD-10-CM

## 2016-01-13 DIAGNOSIS — N138 Other obstructive and reflux uropathy: Secondary | ICD-10-CM | POA: Insufficient documentation

## 2016-01-13 NOTE — Assessment & Plan Note (Addendum)
Reviewed A1c. Encouraged avoiding added sugars.

## 2016-01-13 NOTE — Assessment & Plan Note (Signed)
Reviewed FLP.  ASCVD 24yr risk = 10.2% Not currently interested in statin. Pt motivated to do healthy diet changes and has already started. Suggested return in 6 months to repeat FLP.

## 2016-01-13 NOTE — Assessment & Plan Note (Signed)
DRE suggestive of this, also with LUTS. Start flomax. In setting of elevated PSA, will refer to urology for further discussion but anticipate BPH related.

## 2016-01-13 NOTE — Assessment & Plan Note (Signed)
Reviewed recent elevated PSA, reviewed repeat PSA and free % readings. Discussed isolated PSA readings vs PSA velocity. DRE today suggestive of BPH. Discussed options of serial monitoring vs referral to urology for further evaluation/discussion.  Pt has been very anxious and stressed since elevated PSA reading leading to trouble sleeping. He would be most reassured by referral to urology to discuss options. Referral placed.  He has planned trip to [place] early August with wife. He has not yet told his wife -about his elevated PSA wants to let her know after trip.

## 2016-03-08 ENCOUNTER — Other Ambulatory Visit (INDEPENDENT_AMBULATORY_CARE_PROVIDER_SITE_OTHER): Payer: 59

## 2016-03-08 ENCOUNTER — Telehealth: Payer: Self-pay | Admitting: Family Medicine

## 2016-03-08 DIAGNOSIS — Z0000 Encounter for general adult medical examination without abnormal findings: Secondary | ICD-10-CM | POA: Diagnosis not present

## 2016-03-08 NOTE — Telephone Encounter (Signed)
Unless a form was provided, he will need to come get lab results. I don't know where to send them.

## 2016-03-08 NOTE — Telephone Encounter (Signed)
Pt had nicotine labs done.  Will you send the results to [facility] or does pt need to come by to get the results and take to [facility]

## 2016-03-09 NOTE — Telephone Encounter (Signed)
Form is in your in box for completion.

## 2016-03-09 NOTE — Telephone Encounter (Signed)
PT came in with form to be filled out. PT previously brought form in when he had his physical in July but [facility] was [facility]. Please fill it out and send it to them. Also, Put copy up front for his wife; Clydie BraunKaren, to pick up. [phone]. [phone] (wife)

## 2016-03-09 NOTE — Telephone Encounter (Signed)
Form filled and in Kim's box. We need pt's waist circumference to finish form. Pt also needs to finish filling out form.  Nicotine level pending but will need to be printed to send with form.

## 2016-03-11 NOTE — Telephone Encounter (Signed)
Form was faxed on 01/12/16. However, form did not contain a requirement for nicotine levels. Neither did the new form that patient brought in. Waist circumference measured today and explained to patient about nicotine levels. Copy of previous form in chart and showed patient that no nicotine level required per form, which is why it wasn't tested for. Copy made for patient and he will investigate. Advised when levels are resulted that I will write them on form and fax it back to [facility].

## 2016-03-13 LAB — NICOTINE/COTININE METABOLITES
Cotinine: NOT DETECTED ng/mL
Nicotine: NOT DETECTED ng/mL

## 2016-11-25 ENCOUNTER — Other Ambulatory Visit: Payer: Self-pay | Admitting: Family Medicine

## 2016-11-28 NOTE — Telephone Encounter (Signed)
Last AEX 01/12/2016

## 2017-01-25 ENCOUNTER — Other Ambulatory Visit (INDEPENDENT_AMBULATORY_CARE_PROVIDER_SITE_OTHER): Payer: 59

## 2017-01-25 ENCOUNTER — Other Ambulatory Visit: Payer: Self-pay | Admitting: Family Medicine

## 2017-01-25 DIAGNOSIS — R7303 Prediabetes: Secondary | ICD-10-CM

## 2017-01-25 DIAGNOSIS — Z1159 Encounter for screening for other viral diseases: Secondary | ICD-10-CM

## 2017-01-25 DIAGNOSIS — E785 Hyperlipidemia, unspecified: Secondary | ICD-10-CM

## 2017-01-25 DIAGNOSIS — R972 Elevated prostate specific antigen [PSA]: Secondary | ICD-10-CM

## 2017-01-25 DIAGNOSIS — Z021 Encounter for pre-employment examination: Secondary | ICD-10-CM

## 2017-01-25 NOTE — Addendum Note (Signed)
Addended by: CHAVERS, NATASHA C on: 01/25/2017 07:43 AM   Modules accepted: Orders  

## 2017-01-25 NOTE — Addendum Note (Signed)
Addended by: Baldomero LamyHAVERS, Estel Tonelli C on: 01/25/2017 07:43 AM   Modules accepted: Orders

## 2017-01-26 LAB — COMPREHENSIVE METABOLIC PANEL
A/G RATIO: 1.7 (ref 1.2–2.2)
ALBUMIN: 4.2 g/dL (ref 3.5–5.5)
ALT: 29 IU/L (ref 0–44)
AST: 24 IU/L (ref 0–40)
Alkaline Phosphatase: 87 IU/L (ref 39–117)
BILIRUBIN TOTAL: 0.8 mg/dL (ref 0.0–1.2)
BUN / CREAT RATIO: 13 (ref 9–20)
BUN: 16 mg/dL (ref 6–24)
CHLORIDE: 101 mmol/L (ref 96–106)
CO2: 26 mmol/L (ref 20–29)
Calcium: 9.6 mg/dL (ref 8.7–10.2)
Creatinine, Ser: 1.19 mg/dL (ref 0.76–1.27)
GFR calc non Af Amer: 67 mL/min/{1.73_m2} (ref >59)
GFR, EST AFRICAN AMERICAN: 77 mL/min/{1.73_m2} (ref >59)
GLOBULIN, TOTAL: 2.5 g/dL (ref 1.5–4.5)
Glucose: 106 mg/dL — ABNORMAL HIGH (ref 65–99)
POTASSIUM: 4 mmol/L (ref 3.5–5.2)
SODIUM: 143 mmol/L (ref 134–144)
TOTAL PROTEIN: 6.7 g/dL (ref 6.0–8.5)

## 2017-01-26 LAB — HEMOGLOBIN A1C
Est. average glucose Bld gHb Est-mCnc: 114 mg/dL
Hgb A1c MFr Bld: 5.6 % (ref 4.8–5.6)

## 2017-01-26 LAB — LIPID PANEL
Chol/HDL Ratio: 5.2 ratio — ABNORMAL HIGH (ref 0.0–5.0)
Cholesterol, Total: 161 mg/dL (ref 100–199)
HDL: 31 mg/dL — ABNORMAL LOW (ref >39)
LDL CALC: 60 mg/dL (ref 0–99)
TRIGLYCERIDES: 351 mg/dL — AB (ref 0–149)
VLDL CHOLESTEROL CAL: 70 mg/dL — AB (ref 5–40)

## 2017-01-29 LAB — PSA, TOTAL AND FREE
PROSTATE SPECIFIC AG, SERUM: 6.3 ng/mL — AB (ref 0.0–4.0)
PSA, Free Pct: 14 %
PSA, Free: 0.88 ng/mL

## 2017-01-29 LAB — NICOTINE/COTININE METABOLITES
Cotinine: NOT DETECTED ng/mL
Nicotine: NOT DETECTED ng/mL

## 2017-01-29 LAB — HEPATITIS C ANTIBODY: Hep C Virus Ab: 0.1 s/co ratio (ref 0.0–0.9)

## 2017-02-03 ENCOUNTER — Ambulatory Visit (INDEPENDENT_AMBULATORY_CARE_PROVIDER_SITE_OTHER): Payer: 59 | Admitting: Family Medicine

## 2017-02-03 ENCOUNTER — Encounter: Payer: Self-pay | Admitting: Family Medicine

## 2017-02-03 VITALS — BP 126/72 | HR 74 | Temp 97.9°F | Ht 68.5 in | Wt 220.8 lb

## 2017-02-03 DIAGNOSIS — E785 Hyperlipidemia, unspecified: Secondary | ICD-10-CM | POA: Diagnosis not present

## 2017-02-03 DIAGNOSIS — E669 Obesity, unspecified: Secondary | ICD-10-CM | POA: Diagnosis not present

## 2017-02-03 DIAGNOSIS — Z0000 Encounter for general adult medical examination without abnormal findings: Secondary | ICD-10-CM

## 2017-02-03 DIAGNOSIS — I10 Essential (primary) hypertension: Secondary | ICD-10-CM

## 2017-02-03 DIAGNOSIS — R7303 Prediabetes: Secondary | ICD-10-CM

## 2017-02-03 DIAGNOSIS — N401 Enlarged prostate with lower urinary tract symptoms: Secondary | ICD-10-CM | POA: Diagnosis not present

## 2017-02-03 DIAGNOSIS — N529 Male erectile dysfunction, unspecified: Secondary | ICD-10-CM | POA: Diagnosis not present

## 2017-02-03 DIAGNOSIS — N138 Other obstructive and reflux uropathy: Secondary | ICD-10-CM | POA: Diagnosis not present

## 2017-02-03 DIAGNOSIS — R972 Elevated prostate specific antigen [PSA]: Secondary | ICD-10-CM

## 2017-02-03 MED ORDER — LISINOPRIL-HYDROCHLOROTHIAZIDE 20-12.5 MG PO TABS: 1.0000 | ORAL_TABLET | Freq: Every day | ORAL | 3 refills | Status: DC

## 2017-02-03 MED ORDER — ATORVASTATIN CALCIUM 20 MG PO TABS: 20.0000 mg | ORAL_TABLET | Freq: Every day | ORAL | 1 refills | Status: DC

## 2017-02-03 MED ORDER — SILDENAFIL CITRATE 50 MG PO TABS: 50.0000 mg | ORAL_TABLET | Freq: Every day | ORAL | 3 refills | Status: DC | PRN

## 2017-02-03 NOTE — Assessment & Plan Note (Signed)
Reviewed total and free PSA. Last year unable to complete urology referral - will again refer today. Encouraged he keep appt. Discussed ddx.

## 2017-02-03 NOTE — Patient Instructions (Addendum)
Pass by lab to pick up stool kit See Rosaria Ferries to schedule urology appointment.  Start lipitor 84m daily.  Return in 6 months for follow up visit.   Health Maintenance, Male A healthy lifestyle and preventive care is important for your health and wellness. Ask your health care provider about what schedule of regular examinations is right for you. What should I know about weight and diet? Eat a Healthy Diet  Eat plenty of vegetables, fruits, whole grains, low-fat dairy products, and lean protein.  Do not eat a lot of foods high in solid fats, added sugars, or salt.  Maintain a Healthy Weight Regular exercise can help you achieve or maintain a healthy weight. You should:  Do at least 150 minutes of exercise each week. The exercise should increase your heart rate and make you sweat (moderate-intensity exercise).  Do strength-training exercises at least twice a week.  Watch Your Levels of Cholesterol and Blood Lipids  Have your blood tested for lipids and cholesterol every 5 years starting at 58years of age. If you are at high risk for heart disease, you should start having your blood tested when you are 58years old. You may need to have your cholesterol levels checked more often if: ? Your lipid or cholesterol levels are high. ? You are older than 58years of age. ? You are at high risk for heart disease.  What should I know about cancer screening? Many types of cancers can be detected early and may often be prevented. Lung Cancer  You should be screened every year for lung cancer if: ? You are a current smoker who has smoked for at least 30 years. ? You are a former smoker who has quit within the past 15 years.  Talk to your health care provider about your screening options, when you should start screening, and how often you should be screened.  Colorectal Cancer  Routine colorectal cancer screening usually begins at 58years of age and should be repeated every 5-10 years until  you are 58years old. You may need to be screened more often if early forms of precancerous polyps or small growths are found. Your health care provider may recommend screening at an earlier age if you have risk factors for colon cancer.  Your health care provider may recommend using home test kits to check for hidden blood in the stool.  A small camera at the end of a tube can be used to examine your colon (sigmoidoscopy or colonoscopy). This checks for the earliest forms of colorectal cancer.  Prostate and Testicular Cancer  Depending on your age and overall health, your health care provider may do certain tests to screen for prostate and testicular cancer.  Talk to your health care provider about any symptoms or concerns you have about testicular or prostate cancer.  Skin Cancer  Check your skin from head to toe regularly.  Tell your health care provider about any new moles or changes in moles, especially if: ? There is a change in a mole's size, shape, or color. ? You have a mole that is larger than a pencil eraser.  Always use sunscreen. Apply sunscreen liberally and repeat throughout the day.  Protect yourself by wearing long sleeves, pants, a wide-brimmed hat, and sunglasses when outside.  What should I know about heart disease, diabetes, and high blood pressure?  If you are 179364years of age, have your blood pressure checked every 3-5 years. If you are 40 years  of age or older, have your blood pressure checked every year. You should have your blood pressure measured twice-once when you are at a hospital or clinic, and once when you are not at a hospital or clinic. Record the average of the two measurements. To check your blood pressure when you are not at a hospital or clinic, you can use: ? An automated blood pressure machine at a pharmacy. ? A home blood pressure monitor.  Talk to your health care provider about your target blood pressure.  If you are between 25-79 years  old, ask your health care provider if you should take aspirin to prevent heart disease.  Have regular diabetes screenings by checking your fasting blood sugar level. ? If you are at a normal weight and have a low risk for diabetes, have this test once every three years after the age of 31. ? If you are overweight and have a high risk for diabetes, consider being tested at a younger age or more often.  A one-time screening for abdominal aortic aneurysm (AAA) by ultrasound is recommended for men aged 58-75 years who are current or former smokers. What should I know about preventing infection? Hepatitis B If you have a higher risk for hepatitis B, you should be screened for this virus. Talk with your health care provider to find out if you are at risk for hepatitis B infection. Hepatitis C Blood testing is recommended for:  Everyone born from [birth year] through 1967.  Anyone with known risk factors for hepatitis C.  Sexually Transmitted Diseases (STDs)  You should be screened each year for STDs including gonorrhea and chlamydia if: ? You are sexually active and are younger than 59 years of age. ? You are older than 58 years of age and your health care provider tells you that you are at risk for this type of infection. ? Your sexual activity has changed since you were last screened and you are at an increased risk for chlamydia or gonorrhea. Ask your health care provider if you are at risk.  Talk with your health care provider about whether you are at high risk of being infected with HIV. Your health care provider may recommend a prescription medicine to help prevent HIV infection.  What else can I do?  Schedule regular health, dental, and eye exams.  Stay current with your vaccines (immunizations).  Do not use any tobacco products, such as cigarettes, chewing tobacco, and e-cigarettes. If you need help quitting, ask your health care provider.  Limit alcohol intake to no more than 2 drinks  per day. One drink equals 12 ounces of beer, 5 ounces of wine, or 1 ounces of hard liquor.  Do not use street drugs.  Do not share needles.  Ask your health care provider for help if you need support or information about quitting drugs.  Tell your health care provider if you often feel depressed.  Tell your health care provider if you have ever been abused or do not feel safe at home. This information is not intended to replace advice given to you by your health care provider. Make sure you discuss any questions you have with your health care provider. Document Released: 12/03/2007 Document Revised: 02/03/2016 Document Reviewed: 03/10/2015 Elsevier Interactive Patient Education  [employer].

## 2017-02-03 NOTE — Assessment & Plan Note (Signed)
Discussed healthy diet changes to affect sustainable weight loss. 

## 2017-02-03 NOTE — Assessment & Plan Note (Signed)
Preventative protocols reviewed and updated unless pt declined. Discussed healthy diet and lifestyle.  

## 2017-02-03 NOTE — Progress Notes (Signed)
BP 126/72   Pulse 74   Temp 97.9 F (36.6 C) (Oral)   Ht 5' 8.5" (1.74 m)   Wt 220 lb 12 oz (100.1 kg)   SpO2 98%   BMI 33.08 kg/m    CC: CPE Subjective:    Patient ID: Greg Wallace, male    DOB: [birth year], 56 y.o.   MRN: [id number]  HPI: Greg Wallace is a 56 y.o. male presenting on 02/03/2017 for Annual Exam   Healthy diet changes - avoiding fast foods. Eating more whole organic foods.   Preventative: Colon cancer screening - No BM changes, no blood in stool. iFOB - never returned previously. States he mailed it last year but we never received.  Prostate cancer screening - PSA elevated - referred last year to [facility] - after 3 hour wait he left.  Declines flu shot.  Td 2009  Seat belt use discussed.  Sunscreen use discussed.no changing moles on skin. 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   Relevant past medical, surgical, family and social history reviewed and updated as indicated. Interim medical history since our last visit reviewed. Allergies and medications reviewed and updated. Outpatient Medications Prior to Visit  Medication Sig Dispense Refill  . Multiple Vitamins-Minerals (MULTIVITAMIN PO) Take 1 tablet by mouth daily.    . tamsulosin (FLOMAX) 0.4 MG CAPS capsule Take 1 capsule (0.4 mg total) by mouth daily. 30 capsule 3  . lisinopril-hydrochlorothiazide (PRINZIDE,ZESTORETIC) 20-12.5 MG tablet TAKE 1 TABLET BY MOUTH  DAILY 90 tablet 1  . sildenafil (VIAGRA) 50 MG tablet Take 1 tablet (50 mg total) by mouth daily as needed for erectile dysfunction. 10 tablet 0   No facility-administered medications prior to visit.      Per HPI unless specifically indicated in ROS section below Review of Systems  Constitutional: Negative for activity  change, appetite change, chills, fatigue, fever and unexpected weight change.  HENT: Negative for hearing loss.   Eyes: Negative for visual disturbance.  Respiratory: Negative for cough, chest tightness, shortness of breath and wheezing.   Cardiovascular: Negative for chest pain, palpitations and leg swelling.  Gastrointestinal: Negative for abdominal distention, abdominal pain, blood in stool, constipation, diarrhea, nausea and vomiting.  Genitourinary: Negative for difficulty urinating and hematuria.  Musculoskeletal: Negative for arthralgias, myalgias and neck pain.  Skin: Negative for rash.  Neurological: Negative for dizziness, seizures, syncope and headaches.  Hematological: Negative for adenopathy. Does not bruise/bleed easily.  Psychiatric/Behavioral: Negative for dysphoric mood. The patient is not nervous/anxious.        Objective:    BP 126/72   Pulse 74   Temp 97.9 F (36.6 C) (Oral)   Ht 5' 8.5" (1.74 m)   Wt 220 lb 12 oz (100.1 kg)   SpO2 98%   BMI 33.08 kg/m   Wt Readings from Last 3 Encounters:  02/03/17 220 lb 12 oz (100.1 kg)  01/12/16 223 lb 12 oz (101.5 kg)  12/25/15 220 lb 12 oz (100.1 kg)    Physical Exam  Constitutional: He is oriented to person, place, and time. He appears well-developed and well-nourished. No distress.  HENT:  Head: Normocephalic and atraumatic.  Right Ear: Hearing, tympanic membrane, external ear and ear canal normal.  Left Ear: Hearing, tympanic membrane, external ear and  ear canal normal.  Nose: Nose normal.  Mouth/Throat: Uvula is midline, oropharynx is clear and moist and mucous membranes are normal. No oropharyngeal exudate, posterior oropharyngeal edema or posterior oropharyngeal erythema.  Eyes: Pupils are equal, round, and reactive to light. Conjunctivae and EOM are normal. No scleral icterus.  Neck: Normal range of motion. Neck supple. No thyromegaly present.  Cardiovascular: Normal rate, regular rhythm, normal heart sounds  and intact distal pulses.   No murmur heard. Pulses:      Radial pulses are 2+ on the right side, and 2+ on the left side.  Pulmonary/Chest: Effort normal and breath sounds normal. No respiratory distress. He has no wheezes. He has no rales.  Abdominal: Soft. Bowel sounds are normal. He exhibits no distension and no mass. There is no tenderness. There is no rebound and no guarding.  Genitourinary: Rectum normal. Rectal exam shows no external hemorrhoid, no internal hemorrhoid, no fissure, no mass, no tenderness and anal tone normal. Prostate is enlarged (R>L, ~45gm). Prostate is not tender.  Musculoskeletal: Normal range of motion. He exhibits no edema.  Lymphadenopathy:    He has no cervical adenopathy.  Neurological: He is alert and oriented to person, place, and time.  CN grossly intact, station and gait intact  Skin: Skin is warm and dry. No rash noted.  Psychiatric: He has a normal mood and affect. His behavior is normal. Judgment and thought content normal.  Nursing note and vitals reviewed.  Results for orders placed or performed in visit on 01/25/17  Lipid panel  Result Value Ref Range   Cholesterol, Total 161 100 - 199 mg/dL   Triglycerides 161 (H) 0 - 149 mg/dL   HDL 31 (L) >09 mg/dL   VLDL Cholesterol Cal 70 (H) 5 - 40 mg/dL   LDL Calculated 60 0 - 99 mg/dL   Chol/HDL Ratio 5.2 (H) 0.0 - 5.0 ratio  Hemoglobin A1c  Result Value Ref Range   Hgb A1c MFr Bld 5.6 4.8 - 5.6 %   Est. average glucose Bld gHb Est-mCnc 114 mg/dL  Comprehensive metabolic panel  Result Value Ref Range   Glucose 106 (H) 65 - 99 mg/dL   BUN 16 6 - 24 mg/dL   Creatinine, Ser 6.04 0.76 - 1.27 mg/dL   GFR calc non Af Amer 67 >59 mL/min/1.73   GFR calc Af Amer 77 >59 mL/min/1.73   BUN/Creatinine Ratio 13 9 - 20   Sodium 143 134 - 144 mmol/L   Potassium 4.0 3.5 - 5.2 mmol/L   Chloride 101 96 - 106 mmol/L   CO2 26 20 - 29 mmol/L   Calcium 9.6 8.7 - 10.2 mg/dL   Total Protein 6.7 6.0 - 8.5 g/dL    Albumin 4.2 3.5 - 5.5 g/dL   Globulin, Total 2.5 1.5 - 4.5 g/dL   Albumin/Globulin Ratio 1.7 1.2 - 2.2   Bilirubin Total 0.8 0.0 - 1.2 mg/dL   Alkaline Phosphatase 87 39 - 117 IU/L   AST 24 0 - 40 IU/L   ALT 29 0 - 44 IU/L  Hepatitis C antibody  Result Value Ref Range   Hep C Virus Ab 0.1 0.0 - 0.9 s/co ratio  Nicotine/cotinine metabolites  Result Value Ref Range   Nicotine None Detected ng/mL   Cotinine None Detected ng/mL  PSA, total and free  Result Value Ref Range   Prostate Specific Ag, Serum 6.3 (H) 0.0 - 4.0 ng/mL   PSA, Free 0.88 N/A ng/mL   PSA, Free Pct 14.0 %  Assessment & Plan:   Problem List Items Addressed This Visit    BPH with obstruction/lower urinary tract symptoms   ED (erectile dysfunction)    Requests viagra refilled.       Elevated PSA    Reviewed total and free PSA. Last year unable to complete urology referral - will again refer today. Encouraged he keep appt. Discussed ddx.       Relevant Orders   Ambulatory referral to Urology   Essential hypertension    Chronic, stable. Continue current regimen.       Relevant Medications   atorvastatin (LIPITOR) 20 MG tablet   lisinopril-hydrochlorothiazide (PRINZIDE,ZESTORETIC) 20-12.5 MG tablet   sildenafil (VIAGRA) 50 MG tablet   Healthcare maintenance - Primary    Preventative protocols reviewed and updated unless pt declined. Discussed healthy diet and lifestyle.       HLD (hyperlipidemia)    Chronic, off meds. Reviewed with patient who agrees to start lipitor 20mg  daily.  The 10-year ASCVD risk score Denman George DC Montez Hageman., et al., 2013) is: 9.6%   Values used to calculate the score:     Age: 30 years     Sex: Male     Is Non-Hispanic African American: No     Diabetic: No     Tobacco smoker: No     Systolic Blood Pressure: 126 mmHg     Is BP treated: Yes     HDL Cholesterol: 31 mg/dL     Total Cholesterol: 161 mg/dL       Relevant Medications   atorvastatin (LIPITOR) 20 MG tablet    lisinopril-hydrochlorothiazide (PRINZIDE,ZESTORETIC) 20-12.5 MG tablet   sildenafil (VIAGRA) 50 MG tablet   Obesity, Class I, BMI 30-34.9    Discussed healthy diet changes to affect sustainable weight loss.       Prediabetes    Actually A1c improved this year. Congratulated on healthy diet changes.           Follow up plan: Return in about 6 months (around 08/06/2017) for follow up visit.  Eustaquio Boyden, MD

## 2017-02-03 NOTE — Assessment & Plan Note (Signed)
Chronic, off meds. Reviewed with patient who agrees to start lipitor 20mg  daily.  The 10-year ASCVD risk score Denman George DC Montez Hageman., et al., 2013) is: 9.6%   Values used to calculate the score:     Age: 56 years     Sex: Male     Is Non-Hispanic African American: No     Diabetic: No     Tobacco smoker: No     Systolic Blood Pressure: 126 mmHg     Is BP treated: Yes     HDL Cholesterol: 31 mg/dL     Total Cholesterol: 161 mg/dL

## 2017-02-03 NOTE — Assessment & Plan Note (Signed)
Chronic, stable. Continue current regimen. 

## 2017-02-03 NOTE — Assessment & Plan Note (Signed)
Actually A1c improved this year. Congratulated on healthy diet changes.

## 2017-02-03 NOTE — Assessment & Plan Note (Signed)
Requests viagra refilled.

## 2017-05-12 ENCOUNTER — Other Ambulatory Visit: Payer: Self-pay | Admitting: Family Medicine

## 2017-07-27 ENCOUNTER — Other Ambulatory Visit: Payer: Self-pay | Admitting: Family Medicine

## 2017-08-03 ENCOUNTER — Other Ambulatory Visit: Payer: 59

## 2017-08-07 ENCOUNTER — Ambulatory Visit: Payer: 59 | Admitting: Family Medicine

## 2017-11-21 ENCOUNTER — Other Ambulatory Visit: Payer: Self-pay | Admitting: Urology

## 2017-11-21 ENCOUNTER — Ambulatory Visit: Payer: Self-pay | Admitting: Urology

## 2017-11-21 DIAGNOSIS — N401 Enlarged prostate with lower urinary tract symptoms: Secondary | ICD-10-CM

## 2017-11-21 DIAGNOSIS — R972 Elevated prostate specific antigen [PSA]: Secondary | ICD-10-CM

## 2017-11-21 DIAGNOSIS — R3912 Poor urinary stream: Secondary | ICD-10-CM

## 2017-12-31 ENCOUNTER — Other Ambulatory Visit: Payer: Self-pay | Admitting: Family Medicine

## 2018-01-02 ENCOUNTER — Ambulatory Visit (HOSPITAL_COMMUNITY)
Admission: RE | Admit: 2018-01-02 | Discharge: 2018-01-02 | Disposition: A | Discharge status: Home / Self Care | Payer: Managed Care, Other (non HMO) | Source: Ambulatory Visit | Attending: Urology | Admitting: Urology

## 2018-01-02 ENCOUNTER — Encounter (HOSPITAL_COMMUNITY): Payer: Self-pay

## 2018-01-02 DIAGNOSIS — R972 Elevated prostate specific antigen [PSA]: Secondary | ICD-10-CM

## 2018-01-02 MED ORDER — GENTAMICIN SULFATE 40 MG/ML IJ SOLN
INTRAMUSCULAR | Status: AC
  Administered 2018-01-02: 160 mg via INTRAMUSCULAR
  Filled 2018-01-02: qty 4

## 2018-01-02 MED ORDER — GENTAMICIN SULFATE 40 MG/ML IJ SOLN
160.0000 mg | Freq: Once | INTRAMUSCULAR | Status: AC
  Administered 2018-01-02: 160 mg via INTRAMUSCULAR

## 2018-01-02 MED ORDER — LIDOCAINE HCL (PF) 2 % IJ SOLN
INTRAMUSCULAR | Status: AC
  Administered 2018-01-02: 10 mL
  Filled 2018-01-02: qty 10

## 2018-01-02 NOTE — Consent Form (Signed)
CLINICAL DATA:    Ultrasound was provided for use by the ordering physician, and a technical  charge was applied by the performing facility.  No radiologist  interpretation/professional services rendered.   

## 2018-02-25 ENCOUNTER — Other Ambulatory Visit: Payer: Self-pay | Admitting: Family Medicine

## 2018-02-26 NOTE — Telephone Encounter (Signed)
plz bring those labs to review then we will see if further labs needed.

## 2018-02-26 NOTE — Telephone Encounter (Signed)
Scheduled cpe 10/24. Pt states he already had labs in July. Does he need to get labs done prior to physical?

## 2018-02-26 NOTE — Telephone Encounter (Signed)
Electronic refill request Last office visit 02/03/17 Last refill 01/01/18 #90 note sent to pharmacy for patient to schedule CPE No appointment scheduled.  Please call patient and schedule CPE then send back for refill.

## 2018-03-01 NOTE — Telephone Encounter (Signed)
Spoke with pt relaying Dr. G's message. Pt verbalizes understanding.  

## 2018-03-16 ENCOUNTER — Other Ambulatory Visit: Payer: Self-pay | Admitting: Family Medicine

## 2018-04-12 ENCOUNTER — Encounter: Payer: Managed Care, Other (non HMO) | Admitting: Family Medicine

## 2018-05-08 ENCOUNTER — Other Ambulatory Visit: Payer: Self-pay | Admitting: Family Medicine

## 2018-05-08 ENCOUNTER — Other Ambulatory Visit (INDEPENDENT_AMBULATORY_CARE_PROVIDER_SITE_OTHER): Payer: Managed Care, Other (non HMO)

## 2018-05-08 DIAGNOSIS — R972 Elevated prostate specific antigen [PSA]: Secondary | ICD-10-CM

## 2018-05-08 DIAGNOSIS — R7303 Prediabetes: Secondary | ICD-10-CM

## 2018-05-08 DIAGNOSIS — E785 Hyperlipidemia, unspecified: Secondary | ICD-10-CM

## 2018-05-08 NOTE — Addendum Note (Signed)
Addended by: Wendi MayaGREESON, Sims Laday on: 05/08/2018 08:18 AM   Modules accepted: Orders

## 2018-05-08 NOTE — Addendum Note (Signed)
Addended by: Roshelle Traub on: 05/08/2018 08:18 AM   Modules accepted: Orders  

## 2018-05-08 NOTE — Addendum Note (Signed)
Addended by: Alexismarie Flaim on: 05/08/2018 08:18 AM   Modules accepted: Orders  

## 2018-05-08 NOTE — Addendum Note (Signed)
Addended by: Geral Coker on: 05/08/2018 08:18 AM   Modules accepted: Orders  

## 2018-05-09 LAB — COMPREHENSIVE METABOLIC PANEL
A/G RATIO: 1.8 (ref 1.2–2.2)
ALT: 38 IU/L (ref 0–44)
AST: 21 IU/L (ref 0–40)
Albumin: 4.2 g/dL (ref 3.5–5.5)
Alkaline Phosphatase: 82 IU/L (ref 39–117)
BUN/Creatinine Ratio: 16 (ref 9–20)
BUN: 22 mg/dL (ref 6–24)
Bilirubin Total: 0.8 mg/dL (ref 0.0–1.2)
CALCIUM: 9.5 mg/dL (ref 8.7–10.2)
CO2: 25 mmol/L (ref 20–29)
Chloride: 103 mmol/L (ref 96–106)
Creatinine, Ser: 1.37 mg/dL — ABNORMAL HIGH (ref 0.76–1.27)
GFR calc Af Amer: 65 mL/min/{1.73_m2} (ref >59)
GFR, EST NON AFRICAN AMERICAN: 56 mL/min/{1.73_m2} — AB (ref >59)
GLUCOSE: 98 mg/dL (ref 65–99)
Globulin, Total: 2.3 g/dL (ref 1.5–4.5)
Potassium: 4.5 mmol/L (ref 3.5–5.2)
Sodium: 141 mmol/L (ref 134–144)
Total Protein: 6.5 g/dL (ref 6.0–8.5)

## 2018-05-09 LAB — LIPID PANEL
Chol/HDL Ratio: 4.9 ratio (ref 0.0–5.0)
Cholesterol, Total: 166 mg/dL (ref 100–199)
HDL: 34 mg/dL — ABNORMAL LOW (ref >39)
LDL Calculated: 80 mg/dL (ref 0–99)
TRIGLYCERIDES: 260 mg/dL — AB (ref 0–149)
VLDL Cholesterol Cal: 52 mg/dL — ABNORMAL HIGH (ref 5–40)

## 2018-05-09 LAB — HEMOGLOBIN A1C
ESTIMATED AVERAGE GLUCOSE: 108 mg/dL
HEMOGLOBIN A1C: 5.4 % (ref 4.8–5.6)

## 2018-05-09 LAB — PSA: Prostate Specific Ag, Serum: 4.8 ng/mL — ABNORMAL HIGH (ref 0.0–4.0)

## 2018-05-14 ENCOUNTER — Encounter: Payer: Self-pay | Admitting: Family Medicine

## 2018-05-14 ENCOUNTER — Ambulatory Visit (INDEPENDENT_AMBULATORY_CARE_PROVIDER_SITE_OTHER): Payer: Managed Care, Other (non HMO) | Admitting: Family Medicine

## 2018-05-14 VITALS — BP 124/78 | HR 71 | Temp 97.9°F | Ht 68.5 in | Wt 220.2 lb

## 2018-05-14 DIAGNOSIS — R7303 Prediabetes: Secondary | ICD-10-CM

## 2018-05-14 DIAGNOSIS — E669 Obesity, unspecified: Secondary | ICD-10-CM

## 2018-05-14 DIAGNOSIS — Z1211 Encounter for screening for malignant neoplasm of colon: Secondary | ICD-10-CM

## 2018-05-14 DIAGNOSIS — Z23 Encounter for immunization: Secondary | ICD-10-CM | POA: Diagnosis not present

## 2018-05-14 DIAGNOSIS — I10 Essential (primary) hypertension: Secondary | ICD-10-CM

## 2018-05-14 DIAGNOSIS — Z0000 Encounter for general adult medical examination without abnormal findings: Secondary | ICD-10-CM | POA: Diagnosis not present

## 2018-05-14 DIAGNOSIS — E66811 Obesity, class 1: Secondary | ICD-10-CM

## 2018-05-14 DIAGNOSIS — N138 Other obstructive and reflux uropathy: Secondary | ICD-10-CM

## 2018-05-14 DIAGNOSIS — E785 Hyperlipidemia, unspecified: Secondary | ICD-10-CM

## 2018-05-14 DIAGNOSIS — N401 Enlarged prostate with lower urinary tract symptoms: Secondary | ICD-10-CM

## 2018-05-14 DIAGNOSIS — R972 Elevated prostate specific antigen [PSA]: Secondary | ICD-10-CM

## 2018-05-14 MED ORDER — TAMSULOSIN HCL 0.4 MG PO CAPS: 0.4000 mg | ORAL_CAPSULE | Freq: Every day | ORAL | 3 refills | Status: DC

## 2018-05-14 MED ORDER — LISINOPRIL-HYDROCHLOROTHIAZIDE 20-12.5 MG PO TABS: 1.0000 | ORAL_TABLET | Freq: Every day | ORAL | 0 refills | Status: DC

## 2018-05-14 MED ORDER — ATORVASTATIN CALCIUM 20 MG PO TABS: 20.0000 mg | ORAL_TABLET | Freq: Every day | ORAL | 3 refills | Status: DC

## 2018-05-14 MED ORDER — LISINOPRIL-HYDROCHLOROTHIAZIDE 20-12.5 MG PO TABS: 1.0000 | ORAL_TABLET | Freq: Every day | ORAL | 3 refills | Status: DC

## 2018-05-14 NOTE — Progress Notes (Signed)
BP 124/78 (BP Location: Left Arm, Patient Position: Sitting, Cuff Size: Large)   Pulse 71   Temp 97.9 F (36.6 C) (Oral)   Ht 5' 8.5" (1.74 m)   Wt 220 lb 4 oz (99.9 kg)   SpO2 97%   BMI 33.00 kg/m    CC: CPE Subjective:    Patient ID: Greg Wallace, male    DOB: 10/06/1960, 57 y.o.   MRN: [id number]  HPI: Greg PollackLeon E Bai is a 57 y.o. male presenting on 05/14/2018 for Annual Exam   Getting over stomach virus - declines DRE for this reason.  Healthier diet recently - more plant based foods, less sodas and fried foods. Stays very active. Good year.   Preventative: Colon cancer screening - No BM changes, no blood in stool. iFOB - states he mailed it last 2 years but we never received. Advised drop off physically Prostate cancer screening - PSA elevated - referred to [facility] - benign biopsy "chronically inflamed" 12/2017 ([place]). Nocturia 1-2x at night. Flomax was not very effective.  Declines flu shot.  Td 2009, Tdap today  Seat belt use discussed.  Sunscreen use discussed.no changing moles on skin. Non smoker Alcohol - none Dentist - doesn't see. 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   Relevant past medical, surgical, family and social history reviewed and updated as indicated. Interim medical history since our last visit reviewed. Allergies and medications reviewed and updated. Outpatient Medications Prior to Visit  Medication Sig Dispense Refill  . Multiple Vitamins-Minerals (MULTIVITAMIN PO) Take 1 tablet by mouth daily.    . sildenafil (VIAGRA) 50 MG tablet TAKE 1 TABLET (50 MG TOTAL) BY MOUTH DAILY AS NEEDED FOR ERECTILE DYSFUNCTION. 6 tablet 0  . atorvastatin (LIPITOR) 20 MG tablet Take 1 tablet (20 mg total) by mouth daily. 90  tablet 1  . lisinopril-hydrochlorothiazide (PRINZIDE,ZESTORETIC) 20-12.5 MG tablet TAKE 1 TABLET BY MOUTH  DAILY 90 tablet 0  . tamsulosin (FLOMAX) 0.4 MG CAPS capsule Take 1 capsule (0.4 mg total) by mouth daily. 30 capsule 3   No facility-administered medications prior to visit.      Per HPI unless specifically indicated in ROS section below Review of Systems  Constitutional: Negative for activity change, appetite change, chills, fatigue, fever and unexpected weight change.  HENT: Negative for hearing loss.   Eyes: Negative for visual disturbance.  Respiratory: Negative for cough, chest tightness, shortness of breath and wheezing.   Cardiovascular: Negative for chest pain, palpitations and leg swelling.  Gastrointestinal: Positive for diarrhea (recent GI bug). Negative for abdominal distention, abdominal pain, blood in stool, constipation, nausea and vomiting.  Genitourinary: Negative for difficulty urinating and hematuria.  Musculoskeletal: Negative for arthralgias, myalgias and neck pain.  Skin: Negative for rash.  Neurological: Negative for dizziness, seizures, syncope and headaches.  Hematological: Negative for adenopathy. Does not bruise/bleed easily.  Psychiatric/Behavioral: Negative for dysphoric mood. The patient is not nervous/anxious.        Objective:    BP 124/78 (BP Location: Left Arm, Patient Position: Sitting, Cuff Size: Large)   Pulse 71   Temp 97.9 F (36.6 C) (Oral)   Ht 5' 8.5" (1.74 m)   Wt 220 lb 4 oz (99.9 kg)   SpO2 97%   BMI 33.00  kg/m   Wt Readings from Last 3 Encounters:  05/14/18 220 lb 4 oz (99.9 kg)  02/03/17 220 lb 12 oz (100.1 kg)  01/12/16 223 lb 12 oz (101.5 kg)    Physical Exam  Constitutional: He is oriented to person, place, and time. He appears well-developed and well-nourished. No distress.  HENT:  Head: Normocephalic and atraumatic.  Right Ear: Hearing, tympanic membrane, external ear and ear canal normal.  Left Ear: Hearing,  tympanic membrane, external ear and ear canal normal.  Nose: Nose normal.  Mouth/Throat: Uvula is midline, oropharynx is clear and moist and mucous membranes are normal. No oropharyngeal exudate, posterior oropharyngeal edema or posterior oropharyngeal erythema.  Eyes: Pupils are equal, round, and reactive to light. Conjunctivae and EOM are normal. No scleral icterus.  Neck: Normal range of motion. Neck supple. No thyromegaly present.  Cardiovascular: Normal rate, regular rhythm, normal heart sounds and intact distal pulses.  No murmur heard. Pulses:      Radial pulses are 2+ on the right side, and 2+ on the left side.  Pulmonary/Chest: Effort normal and breath sounds normal. No respiratory distress. He has no wheezes. He has no rales.  Abdominal: Soft. Bowel sounds are normal. He exhibits no distension and no mass. There is no tenderness. There is no rebound and no guarding.  Genitourinary:  Genitourinary Comments: DRE deferred  Musculoskeletal: Normal range of motion. He exhibits no edema.  Lymphadenopathy:    He has no cervical adenopathy.  Neurological: He is alert and oriented to person, place, and time.  CN grossly intact, station and gait intact  Skin: Skin is warm and dry. No rash noted.  Psychiatric: He has a normal mood and affect. His behavior is normal. Judgment and thought content normal.  Nursing note and vitals reviewed.  Results for orders placed or performed in visit on 05/08/18  Lipid panel  Result Value Ref Range   Cholesterol, Total 166 100 - 199 mg/dL   Triglycerides 161 (H) 0 - 149 mg/dL   HDL 34 (L) >09 mg/dL   VLDL Cholesterol Cal 52 (H) 5 - 40 mg/dL   LDL Calculated 80 0 - 99 mg/dL   Chol/HDL Ratio 4.9 0.0 - 5.0 ratio  PSA  Result Value Ref Range   Prostate Specific Ag, Serum 4.8 (H) 0.0 - 4.0 ng/mL  Hemoglobin A1c  Result Value Ref Range   Hgb A1c MFr Bld 5.4 4.8 - 5.6 %   Est. average glucose Bld gHb Est-mCnc 108 mg/dL  Comprehensive metabolic panel    Result Value Ref Range   Glucose 98 65 - 99 mg/dL   BUN 22 6 - 24 mg/dL   Creatinine, Ser 6.04 (H) 0.76 - 1.27 mg/dL   GFR calc non Af Amer 56 (L) >59 mL/min/1.73   GFR calc Af Amer 65 >59 mL/min/1.73   BUN/Creatinine Ratio 16 9 - 20   Sodium 141 134 - 144 mmol/L   Potassium 4.5 3.5 - 5.2 mmol/L   Chloride 103 96 - 106 mmol/L   CO2 25 20 - 29 mmol/L   Calcium 9.5 8.7 - 10.2 mg/dL   Total Protein 6.5 6.0 - 8.5 g/dL   Albumin 4.2 3.5 - 5.5 g/dL   Globulin, Total 2.3 1.5 - 4.5 g/dL   Albumin/Globulin Ratio 1.8 1.2 - 2.2   Bilirubin Total 0.8 0.0 - 1.2 mg/dL   Alkaline Phosphatase 82 39 - 117 IU/L   AST 21 0 - 40 IU/L   ALT 38 0 - 44  IU/L      Assessment & Plan:   Problem List Items Addressed This Visit    Prediabetes    A1c normal range this year.       Obesity, Class I, BMI 30-34.9    Has implemented healthy changes this year. Motivated to continue. Continue to encourage healthy diet and lifestyle change.       HLD (hyperlipidemia)    Chronic, stable on atorvastatin (started 01/2017). Continue. The 10-year ASCVD risk score Denman George DC Montez Hageman., et al., 2013) is: 9.8%   Values used to calculate the score:     Age: 45 years     Sex: Male     Is Non-Hispanic African American: No     Diabetic: No     Tobacco smoker: No     Systolic Blood Pressure: 124 mmHg     Is BP treated: Yes     HDL Cholesterol: 34 mg/dL     Total Cholesterol: 166 mg/dL       Relevant Medications   atorvastatin (LIPITOR) 20 MG tablet   lisinopril-hydrochlorothiazide (PRINZIDE,ZESTORETIC) 20-12.5 MG tablet   Healthcare maintenance - Primary    Preventative protocols reviewed and updated unless pt declined. Discussed healthy diet and lifestyle.       Essential hypertension    Chronic, stable. Continue current regimen.       Relevant Medications   atorvastatin (LIPITOR) 20 MG tablet   lisinopril-hydrochlorothiazide (PRINZIDE,ZESTORETIC) 20-12.5 MG tablet   Elevated PSA    Benign biopsy 12/2017 -  acute and chronic prostatic inflammation. Reviewed, rec yearly DRE/PSA can monitor here.       BPH with obstruction/lower urinary tract symptoms    DRE deferred due to recent GI illness. Ongoing nocturia - will trial flomax 0.8mg  nightly. Update with effect in 1 month.       Relevant Medications   tamsulosin (FLOMAX) 0.4 MG CAPS capsule    Other Visit Diagnoses    Special screening for malignant neoplasms, colon       Relevant Orders   Fecal occult blood, imunochemical   Need for Tdap vaccination       Relevant Orders   Tdap vaccine greater than or equal to 7yo IM (Completed)       Meds ordered this encounter  Medications  . tamsulosin (FLOMAX) 0.4 MG CAPS capsule    Sig: Take 1 capsule (0.4 mg total) by mouth daily.    Dispense:  60 capsule    Refill:  3  . DISCONTD: lisinopril-hydrochlorothiazide (PRINZIDE,ZESTORETIC) 20-12.5 MG tablet    Sig: Take 1 tablet by mouth daily.    Dispense:  30 tablet    Refill:  0  . atorvastatin (LIPITOR) 20 MG tablet    Sig: Take 1 tablet (20 mg total) by mouth daily.    Dispense:  90 tablet    Refill:  3  . lisinopril-hydrochlorothiazide (PRINZIDE,ZESTORETIC) 20-12.5 MG tablet    Sig: Take 1 tablet by mouth daily.    Dispense:  90 tablet    Refill:  3   Orders Placed This Encounter  Procedures  . Fecal occult blood, imunochemical    Standing Status:   Future    Standing Expiration Date:   05/15/2019  . Tdap vaccine greater than or equal to 7yo IM    Follow up plan: Return in about 1 year (around 05/15/2019) for annual exam, prior fasting for blood work.  Eustaquio Boyden, MD

## 2018-05-14 NOTE — Assessment & Plan Note (Signed)
DRE deferred due to recent GI illness. Ongoing nocturia - will trial flomax 0.8mg  nightly. Update with effect in 1 month.

## 2018-05-14 NOTE — Assessment & Plan Note (Addendum)
Chronic, stable on atorvastatin (started 01/2017). Continue. The 10-year ASCVD risk score Denman George(Goff DC Montez HagemanJr., et al., 2013) is: 9.8%   Values used to calculate the score:     Age: [age] years     Sex: Male     Is Non-Hispanic African American: No     Diabetic: No     Tobacco smoker: No     Systolic Blood Pressure: 124 mmHg     Is BP treated: Yes     HDL Cholesterol: 34 mg/dL     Total Cholesterol: 166 mg/dL

## 2018-05-14 NOTE — Assessment & Plan Note (Signed)
Has implemented healthy changes this year. Motivated to continue. Continue to encourage healthy diet and lifestyle change.

## 2018-05-14 NOTE — Assessment & Plan Note (Signed)
Chronic, stable. Continue current regimen. 

## 2018-05-14 NOTE — Patient Instructions (Addendum)
Tdap today Pass by lab for stool kit - drop off physically. Try flomax again, 2 at bedtime this time.   Health Maintenance, Male A healthy lifestyle and preventive care is important for your health and wellness. Ask your health care provider about what schedule of regular examinations is right for you. What should I know about weight and diet? Eat a Healthy Diet  Eat plenty of vegetables, fruits, whole grains, low-fat dairy products, and lean protein.  Do not eat a lot of foods high in solid fats, added sugars, or salt.  Maintain a Healthy Weight Regular exercise can help you achieve or maintain a healthy weight. You should:  Do at least 150 minutes of exercise each week. The exercise should increase your heart rate and make you sweat (moderate-intensity exercise).  Do strength-training exercises at least twice a week.  Watch Your Levels of Cholesterol and Blood Lipids  Have your blood tested for lipids and cholesterol every 5 years starting at 59 years of age. If you are at high risk for heart disease, you should start having your blood tested when you are 59 years old. You may need to have your cholesterol levels checked more often if: ? Your lipid or cholesterol levels are high. ? You are older than 59 years of age. ? You are at high risk for heart disease.  What should I know about cancer screening? Many types of cancers can be detected early and may often be prevented. Lung Cancer  You should be screened every year for lung cancer if: ? You are a current smoker who has smoked for at least 30 years. ? You are a former smoker who has quit within the past 15 years.  Talk to your health care provider about your screening options, when you should start screening, and how often you should be screened.  Colorectal Cancer  Routine colorectal cancer screening usually begins at 59 years of age and should be repeated every 5-10 years until you are 59 years old. You may need to be  screened more often if early forms of precancerous polyps or small growths are found. Your health care provider may recommend screening at an earlier age if you have risk factors for colon cancer.  Your health care provider may recommend using home test kits to check for hidden blood in the stool.  A small camera at the end of a tube can be used to examine your colon (sigmoidoscopy or colonoscopy). This checks for the earliest forms of colorectal cancer.  Prostate and Testicular Cancer  Depending on your age and overall health, your health care provider may do certain tests to screen for prostate and testicular cancer.  Talk to your health care provider about any symptoms or concerns you have about testicular or prostate cancer.  Skin Cancer  Check your skin from head to toe regularly.  Tell your health care provider about any new moles or changes in moles, especially if: ? There is a change in a mole's size, shape, or color. ? You have a mole that is larger than a pencil eraser.  Always use sunscreen. Apply sunscreen liberally and repeat throughout the day.  Protect yourself by wearing long sleeves, pants, a wide-brimmed hat, and sunglasses when outside.  What should I know about heart disease, diabetes, and high blood pressure?  If you are 56-65 years of age, have your blood pressure checked every 3-5 years. If you are 64 years of age or older, have your blood pressure  checked every year. You should have your blood pressure measured twice-once when you are at a hospital or clinic, and once when you are not at a hospital or clinic. Record the average of the two measurements. To check your blood pressure when you are not at a hospital or clinic, you can use: ? An automated blood pressure machine at a pharmacy. ? A home blood pressure monitor.  Talk to your health care provider about your target blood pressure.  If you are between 78-22 years old, ask your health care provider if you  should take aspirin to prevent heart disease.  Have regular diabetes screenings by checking your fasting blood sugar level. ? If you are at a normal weight and have a low risk for diabetes, have this test once every three years after the age of 78. ? If you are overweight and have a high risk for diabetes, consider being tested at a younger age or more often.  A one-time screening for abdominal aortic aneurysm (AAA) by ultrasound is recommended for men aged 41-75 years who are current or former smokers. What should I know about preventing infection? Hepatitis B If you have a higher risk for hepatitis B, you should be screened for this virus. Talk with your health care provider to find out if you are at risk for hepatitis B infection. Hepatitis C Blood testing is recommended for:  Everyone born from [birth year] through 1967.  Anyone with known risk factors for hepatitis C.  Sexually Transmitted Diseases (STDs)  You should be screened each year for STDs including gonorrhea and chlamydia if: ? You are sexually active and are younger than 59 years of age. ? You are older than 59 years of age and your health care provider tells you that you are at risk for this type of infection. ? Your sexual activity has changed since you were last screened and you are at an increased risk for chlamydia or gonorrhea. Ask your health care provider if you are at risk.  Talk with your health care provider about whether you are at high risk of being infected with HIV. Your health care provider may recommend a prescription medicine to help prevent HIV infection.  What else can I do?  Schedule regular health, dental, and eye exams.  Stay current with your vaccines (immunizations).  Do not use any tobacco products, such as cigarettes, chewing tobacco, and e-cigarettes. If you need help quitting, ask your health care provider.  Limit alcohol intake to no more than 2 drinks per day. One drink equals 12 ounces of beer,  5 ounces of wine, or 1 ounces of hard liquor.  Do not use street drugs.  Do not share needles.  Ask your health care provider for help if you need support or information about quitting drugs.  Tell your health care provider if you often feel depressed.  Tell your health care provider if you have ever been abused or do not feel safe at home. This information is not intended to replace advice given to you by your health care provider. Make sure you discuss any questions you have with your health care provider. Document Released: 12/03/2007 Document Revised: 02/03/2016 Document Reviewed: 03/10/2015 Elsevier Interactive Patient Education  [employer].

## 2018-05-14 NOTE — Assessment & Plan Note (Signed)
A1c normal range this year.  

## 2018-05-14 NOTE — Assessment & Plan Note (Signed)
Preventative protocols reviewed and updated unless pt declined. Discussed healthy diet and lifestyle.  

## 2018-05-14 NOTE — Assessment & Plan Note (Signed)
Benign biopsy 12/2017 - acute and chronic prostatic inflammation. Reviewed, rec yearly DRE/PSA can monitor here.

## 2018-05-16 ENCOUNTER — Other Ambulatory Visit: Payer: Self-pay | Admitting: Family Medicine

## 2018-05-21 ENCOUNTER — Encounter: Payer: Self-pay | Admitting: Family Medicine

## 2018-05-21 MED ORDER — TAMSULOSIN HCL 0.4 MG PO CAPS: 0.4000 mg | ORAL_CAPSULE | Freq: Every day | ORAL | 3 refills | Status: DC

## 2018-05-21 NOTE — Telephone Encounter (Signed)
Rx re-sent as requested.

## 2018-05-21 NOTE — Telephone Encounter (Signed)
Spoke with [facility] asking if rx was received. States they did not receive today's e-script yet. Gave refill info verbally.  Says they will fill for pt. Notified pt via MyChart.

## 2018-07-07 ENCOUNTER — Other Ambulatory Visit: Payer: Self-pay | Admitting: Family Medicine

## 2018-07-09 NOTE — Telephone Encounter (Signed)
Electronic refill request Sildenafil Last office visit 05/14/18 Last refill 05/16/18 #6

## 2018-11-15 ENCOUNTER — Other Ambulatory Visit: Payer: Self-pay | Admitting: Family Medicine

## 2018-11-15 NOTE — Telephone Encounter (Signed)
Last visit on 05/05/2018. Does patient need follow up appointment scheduled before refilling?

## 2019-01-31 ENCOUNTER — Telehealth: Payer: Self-pay | Admitting: Family Medicine

## 2019-01-31 NOTE — Telephone Encounter (Signed)
Pt called back and said disregard. He had wrong information from wife. His apologies. Pt is scheduled for labs 11/24 and physical 12/1.

## 2019-01-31 NOTE — Telephone Encounter (Signed)
Noted  

## 2019-01-31 NOTE — Telephone Encounter (Signed)
Pt needs lab work for [employer] for [employer]. Pt scheduled his physical for 12/1 due to him having one last year on 05/14/18. Pt states the insurance needs labs done now so he wants to know if he can get these done. I am unsure of what exactly they need ordered. He said regular things he gets when he has physical. He also mentioned nicotine.  I told him I would ask and call him back or someone will call him back.   CB [phone]

## 2019-03-14 ENCOUNTER — Other Ambulatory Visit: Payer: Self-pay | Admitting: Family Medicine

## 2019-04-09 ENCOUNTER — Other Ambulatory Visit: Payer: Self-pay | Admitting: Family Medicine

## 2019-05-06 ENCOUNTER — Other Ambulatory Visit: Payer: Self-pay | Admitting: Family Medicine

## 2019-05-09 ENCOUNTER — Other Ambulatory Visit (INDEPENDENT_AMBULATORY_CARE_PROVIDER_SITE_OTHER): Payer: Managed Care, Other (non HMO)

## 2019-05-09 ENCOUNTER — Other Ambulatory Visit: Payer: Self-pay | Admitting: Family Medicine

## 2019-05-09 DIAGNOSIS — R972 Elevated prostate specific antigen [PSA]: Secondary | ICD-10-CM

## 2019-05-09 DIAGNOSIS — R7303 Prediabetes: Secondary | ICD-10-CM

## 2019-05-09 DIAGNOSIS — E785 Hyperlipidemia, unspecified: Secondary | ICD-10-CM

## 2019-05-09 NOTE — Addendum Note (Signed)
Addended by: Ellamae Sia on: 05/09/2019 09:11 AM   Modules accepted: Orders

## 2019-05-10 LAB — COMPREHENSIVE METABOLIC PANEL
ALT: 33 IU/L (ref 0–44)
AST: 25 IU/L (ref 0–40)
Albumin/Globulin Ratio: 1.7 (ref 1.2–2.2)
Albumin: 4 g/dL (ref 3.8–4.9)
Alkaline Phosphatase: 107 IU/L (ref 39–117)
BUN/Creatinine Ratio: 14 (ref 10–24)
BUN: 19 mg/dL (ref 8–27)
Bilirubin Total: 1 mg/dL (ref 0.0–1.2)
CO2: 27 mmol/L (ref 20–29)
Calcium: 9.6 mg/dL (ref 8.6–10.2)
Chloride: 102 mmol/L (ref 96–106)
Creatinine, Ser: 1.35 mg/dL — ABNORMAL HIGH (ref 0.76–1.27)
GFR calc Af Amer: 66 mL/min/{1.73_m2} (ref >59)
GFR calc non Af Amer: 57 mL/min/{1.73_m2} — ABNORMAL LOW (ref >59)
Globulin, Total: 2.4 g/dL (ref 1.5–4.5)
Glucose: 104 mg/dL — ABNORMAL HIGH (ref 65–99)
Potassium: 4.3 mmol/L (ref 3.5–5.2)
Sodium: 140 mmol/L (ref 134–144)
Total Protein: 6.4 g/dL (ref 6.0–8.5)

## 2019-05-10 LAB — LIPID PANEL
Chol/HDL Ratio: 3.4 ratio (ref 0.0–5.0)
Cholesterol, Total: 110 mg/dL (ref 100–199)
HDL: 32 mg/dL — ABNORMAL LOW (ref >39)
LDL Chol Calc (NIH): 51 mg/dL (ref 0–99)
Triglycerides: 160 mg/dL — ABNORMAL HIGH (ref 0–149)
VLDL Cholesterol Cal: 27 mg/dL (ref 5–40)

## 2019-05-10 LAB — HEMOGLOBIN A1C
Est. average glucose Bld gHb Est-mCnc: 114 mg/dL
Hgb A1c MFr Bld: 5.6 % (ref 4.8–5.6)

## 2019-05-10 LAB — PSA: Prostate Specific Ag, Serum: 6.5 ng/mL — ABNORMAL HIGH (ref 0.0–4.0)

## 2019-05-14 ENCOUNTER — Other Ambulatory Visit: Payer: Managed Care, Other (non HMO)

## 2019-05-21 ENCOUNTER — Encounter: Payer: Managed Care, Other (non HMO) | Admitting: Family Medicine

## 2019-05-21 ENCOUNTER — Other Ambulatory Visit: Payer: Managed Care, Other (non HMO)

## 2019-06-03 ENCOUNTER — Other Ambulatory Visit: Payer: Self-pay | Admitting: Family Medicine

## 2019-06-25 ENCOUNTER — Encounter: Payer: Self-pay | Admitting: Family Medicine

## 2019-06-25 DIAGNOSIS — R5383 Other fatigue: Secondary | ICD-10-CM

## 2019-06-25 DIAGNOSIS — E785 Hyperlipidemia, unspecified: Secondary | ICD-10-CM

## 2019-06-25 DIAGNOSIS — R972 Elevated prostate specific antigen [PSA]: Secondary | ICD-10-CM

## 2019-06-25 DIAGNOSIS — N289 Disorder of kidney and ureter, unspecified: Secondary | ICD-10-CM

## 2019-06-25 DIAGNOSIS — R7303 Prediabetes: Secondary | ICD-10-CM

## 2019-06-29 ENCOUNTER — Other Ambulatory Visit: Payer: Self-pay | Admitting: Family Medicine

## 2019-07-03 ENCOUNTER — Encounter: Payer: Managed Care, Other (non HMO) | Admitting: Family Medicine

## 2019-07-08 ENCOUNTER — Other Ambulatory Visit: Payer: Self-pay | Admitting: Family Medicine

## 2019-07-08 DIAGNOSIS — R5383 Other fatigue: Secondary | ICD-10-CM

## 2019-07-08 NOTE — Addendum Note (Signed)
Addended by: Eustaquio Boyden on: 07/08/2019 07:54 AM   Modules accepted: Orders

## 2019-07-08 NOTE — Telephone Encounter (Signed)
Labs ordered. plz schedule 8am lab visit this week.

## 2019-07-09 ENCOUNTER — Other Ambulatory Visit: Payer: Managed Care, Other (non HMO)

## 2019-07-09 ENCOUNTER — Other Ambulatory Visit (INDEPENDENT_AMBULATORY_CARE_PROVIDER_SITE_OTHER): Payer: Managed Care, Other (non HMO)

## 2019-07-09 DIAGNOSIS — R5383 Other fatigue: Secondary | ICD-10-CM | POA: Diagnosis not present

## 2019-07-10 LAB — CBC
Hematocrit: 49.4 % (ref 37.5–51.0)
Hemoglobin: 16.6 g/dL (ref 13.0–17.7)
MCH: 30.2 pg (ref 26.6–33.0)
MCHC: 33.6 g/dL (ref 31.5–35.7)
MCV: 90 fL (ref 79–97)
Platelets: 176 10*3/uL (ref 150–450)
RBC: 5.49 x10E6/uL (ref 4.14–5.80)
RDW: 12.3 % (ref 11.6–15.4)
WBC: 8.4 10*3/uL (ref 3.4–10.8)

## 2019-07-10 LAB — RENAL FUNCTION PANEL
Albumin: 4 g/dL (ref 3.8–4.9)
BUN/Creatinine Ratio: 14 (ref 10–24)
BUN: 17 mg/dL (ref 8–27)
CO2: 24 mmol/L (ref 20–29)
Calcium: 9.3 mg/dL (ref 8.6–10.2)
Chloride: 104 mmol/L (ref 96–106)
Creatinine, Ser: 1.22 mg/dL (ref 0.76–1.27)
GFR calc Af Amer: 74 mL/min/{1.73_m2} (ref >59)
GFR calc non Af Amer: 64 mL/min/{1.73_m2} (ref >59)
Glucose: 108 mg/dL — ABNORMAL HIGH (ref 65–99)
Phosphorus: 3.7 mg/dL (ref 2.8–4.1)
Potassium: 4.4 mmol/L (ref 3.5–5.2)
Sodium: 142 mmol/L (ref 134–144)

## 2019-07-10 LAB — TESTOSTERONE: Testosterone: 344 ng/dL (ref 264–916)

## 2019-07-10 LAB — VITAMIN D 25 HYDROXY (VIT D DEFICIENCY, FRACTURES): Vit D, 25-Hydroxy: 28.1 ng/mL — ABNORMAL LOW (ref 30.0–100.0)

## 2019-07-10 LAB — VITAMIN B12: Vitamin B-12: 437 pg/mL (ref 232–1245)

## 2019-07-10 LAB — TSH: TSH: 2 u[IU]/mL (ref 0.450–4.500)

## 2019-07-15 ENCOUNTER — Ambulatory Visit (INDEPENDENT_AMBULATORY_CARE_PROVIDER_SITE_OTHER): Payer: Managed Care, Other (non HMO) | Admitting: Family Medicine

## 2019-07-15 ENCOUNTER — Other Ambulatory Visit: Payer: Self-pay

## 2019-07-15 ENCOUNTER — Other Ambulatory Visit: Payer: Self-pay | Admitting: Family Medicine

## 2019-07-15 ENCOUNTER — Encounter: Payer: Self-pay | Admitting: Family Medicine

## 2019-07-15 VITALS — BP 128/80 | HR 63 | Temp 96.9°F | Ht 68.5 in | Wt 216.0 lb

## 2019-07-15 DIAGNOSIS — N138 Other obstructive and reflux uropathy: Secondary | ICD-10-CM

## 2019-07-15 DIAGNOSIS — Z1211 Encounter for screening for malignant neoplasm of colon: Secondary | ICD-10-CM

## 2019-07-15 DIAGNOSIS — N401 Enlarged prostate with lower urinary tract symptoms: Secondary | ICD-10-CM

## 2019-07-15 DIAGNOSIS — R7303 Prediabetes: Secondary | ICD-10-CM

## 2019-07-15 DIAGNOSIS — E559 Vitamin D deficiency, unspecified: Secondary | ICD-10-CM

## 2019-07-15 DIAGNOSIS — E785 Hyperlipidemia, unspecified: Secondary | ICD-10-CM

## 2019-07-15 DIAGNOSIS — I10 Essential (primary) hypertension: Secondary | ICD-10-CM | POA: Diagnosis not present

## 2019-07-15 DIAGNOSIS — R972 Elevated prostate specific antigen [PSA]: Secondary | ICD-10-CM

## 2019-07-15 MED ORDER — VITAMIN D3 25 MCG (1000 UT) PO CAPS: 1.0000 | ORAL_CAPSULE | Freq: Every day | ORAL | Status: AC

## 2019-07-15 MED ORDER — FINASTERIDE 5 MG PO TABS: 5.0000 mg | ORAL_TABLET | Freq: Every day | ORAL | 6 refills | Status: DC

## 2019-07-15 NOTE — Assessment & Plan Note (Signed)
A1c normal range this year.  

## 2019-07-15 NOTE — Assessment & Plan Note (Signed)
rec start OTC vit D3 1000 IU daily

## 2019-07-15 NOTE — Assessment & Plan Note (Signed)
Benign biopsy 12/2017 - done for elevated PSA along with enlarged R lobe. Will request latest urology note. Discussed effect of finasteride on PSA. He wants to trial for nocturia. RTC 3 months with PSA.

## 2019-07-15 NOTE — Addendum Note (Signed)
Addended by: Alvina Chou on: 07/15/2019 04:06 PM   Modules accepted: Orders

## 2019-07-15 NOTE — Assessment & Plan Note (Signed)
Chronic, stable on statin - continue. The ASCVD Risk score (Goff DC Jr., et al., 2013) failed to calculate for the following reasons:   The valid total cholesterol range is 130 to 320 mg/dL  

## 2019-07-15 NOTE — Assessment & Plan Note (Signed)
Ongoing nocturia x1. No significant change on or off flomax. Interested in trial of finasteride. Reviewed mechanism of action, sent in.

## 2019-07-15 NOTE — Assessment & Plan Note (Signed)
Chronic, stable. Continue current regimen. 

## 2019-07-15 NOTE — Progress Notes (Signed)
Virtual visit completed through [employer]. Due to national recommendations of social distancing due to COVID-19, a virtual visit is felt to be most appropriate for this patient at this time. Reviewed limitations of a virtual visit.   Patient location: in office at work.  Provider location: Adult nurse at [facility], office If any vitals were documented, they were collected by patient at home unless specified below.    BP 128/80   Pulse 63   Temp (!) 96.9 F (36.1 C)   Ht 5' 8.5" (1.74 m)   Wt 216 lb (98 kg)   SpO2 96%   BMI 32.36 kg/m    CC: CPE - converted to regular virtual visit Subjective:    Patient ID: Greg Wallace, male    DOB: September 08, 1960, 59 y.o.   MRN: [id number]  HPI: Greg Wallace is a 59 y.o. male presenting on 07/15/2019 for Annual Exam   Patient scheduled virtual physical for today. In interim, his insurance stopped covering virtual physicals. Reviewed options of rescheduling physical in office or proceeding as regular office visit with possible copay/deductible. He opts to proceed with virtual visit today.   Preventative: Colon cancer screening - No BM changes, no blood in stool. Has not completed iFOB. Agrees to this year . Prostate cancer screening - PSA elevated -referred to [facility] - benign biopsy "chronically inflamed" 12/2017 ([place]). Nocturia 1-2x at night. Flomax was not very effective.      Relevant past medical, surgical, family and social history reviewed and updated as indicated. Interim medical history since our last visit reviewed. Allergies and medications reviewed and updated. Outpatient Medications Prior to Visit  Medication Sig Dispense Refill  . atorvastatin (LIPITOR) 20 MG tablet TAKE 1 TABLET BY MOUTH  DAILY 90 tablet 0  . lisinopril-hydrochlorothiazide (ZESTORETIC) 20-12.5 MG tablet TAKE 1 TABLET BY MOUTH  DAILY 90 tablet 0  . Multiple Vitamins-Minerals (MULTIVITAMIN PO) Take 1 tablet by mouth daily.    . sildenafil (VIAGRA) 50 MG  tablet TAKE 1 TABLET BY MOUTH DAILY AS NEEDED FOR ERECTILE DYSFUNCTION. DAY SUPPLY PER INSURANCE. 6 tablet 1  . tamsulosin (FLOMAX) 0.4 MG CAPS capsule Take 1 capsule (0.4 mg total) by mouth daily. 60 capsule 3   No facility-administered medications prior to visit.     Per HPI unless specifically indicated in ROS section below Review of Systems Objective:    BP 128/80   Pulse 63   Temp (!) 96.9 F (36.1 C)   Ht 5' 8.5" (1.74 m)   Wt 216 lb (98 kg)   SpO2 96%   BMI 32.36 kg/m   Wt Readings from Last 3 Encounters:  07/15/19 216 lb (98 kg)  05/14/18 220 lb 4 oz (99.9 kg)  02/03/17 220 lb 12 oz (100.1 kg)     Physical exam: Gen: alert, NAD, not ill appearing Pulm: speaks in complete sentences without increased work of breathing Psych: normal mood, normal thought content      Results for orders placed or performed in visit on 07/08/19  Testosterone  Result Value Ref Range   Testosterone 344 264 - 916 ng/dL  TSH  Result Value Ref Range   TSH 2.000 0.450 - 4.500 uIU/mL  VITAMIN D 25 Hydroxy (Vit-D Deficiency, Fractures)  Result Value Ref Range   Vit D, 25-Hydroxy 28.1 (L) 30.0 - 100.0 ng/mL  Renal function panel  Result Value Ref Range   Glucose 108 (H) 65 - 99 mg/dL   BUN 17 8 - 27 mg/dL  Creatinine, Ser 1.22 0.76 - 1.27 mg/dL   GFR calc non Af Amer 64 >59 mL/min/1.73   GFR calc Af Amer 74 >59 mL/min/1.73   BUN/Creatinine Ratio 14 10 - 24   Sodium 142 134 - 144 mmol/L   Potassium 4.4 3.5 - 5.2 mmol/L   Chloride 104 96 - 106 mmol/L   CO2 24 20 - 29 mmol/L   Calcium 9.3 8.6 - 10.2 mg/dL   Phosphorus 3.7 2.8 - 4.1 mg/dL   Albumin 4.0 3.8 - 4.9 g/dL  Vitamin B12  Result Value Ref Range   Vitamin B-12 437 232 - 1,245 pg/mL  CBC  Result Value Ref Range   WBC 8.4 3.4 - 10.8 x10E3/uL   RBC 5.49 4.14 - 5.80 x10E6/uL   Hemoglobin 16.6 13.0 - 17.7 g/dL   Hematocrit 49.4 37.5 - 51.0 %   MCV 90 79 - 97 fL   MCH 30.2 26.6 - 33.0 pg   MCHC 33.6 31.5 - 35.7 g/dL   RDW  12.3 11.6 - 15.4 %   Platelets 176 150 - 450 x10E3/uL   Lab Results  Component Value Date   CHOL 110 05/09/2019   HDL 32 (L) 05/09/2019   LDLCALC 51 05/09/2019   LDLDIRECT 120.0 12/25/2015   TRIG 160 (H) 05/09/2019   CHOLHDL 3.4 05/09/2019    Lab Results  Component Value Date   HGBA1C 5.6 05/09/2019    Assessment & Plan:   Problem List Items Addressed This Visit    Vitamin D deficiency    rec start OTC vit D3 1000 IU daily      Prediabetes    A1c normal range this year.       HLD (hyperlipidemia)    Chronic, stable on statin - continue. The ASCVD Risk score Mikey Bussing DC Jr., et al., 2013) failed to calculate for the following reasons:   The valid total cholesterol range is 130 to 320 mg/dL       Essential hypertension    Chronic, stable. Continue current regimen.       Elevated PSA    Benign biopsy 12/2017 - done for elevated PSA along with enlarged R lobe. Will request latest urology note. Discussed effect of finasteride on PSA. He wants to trial for nocturia. RTC 3 months with PSA.       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 No significant change on or off flomax. Interested in trial of finasteride. Reviewed mechanism of action, sent in.       Relevant Medications   finasteride (PROSCAR) 5 MG tablet   Other Relevant Orders   PSA, Total with Reflex to PSA, Free    Other Visit Diagnoses    Special screening for malignant neoplasms, colon       Relevant Orders   Fecal occult blood, imunochemical       Meds ordered this encounter  Medications  . finasteride (PROSCAR) 5 MG tablet    Sig: Take 1 tablet (5 mg total) by mouth daily.    Dispense:  30 tablet    Refill:  6  . Cholecalciferol (VITAMIN D3) 25 MCG (1000 UT) CAPS    Sig: Take 1 capsule (1,000 Units total) by mouth daily.    Dispense:  30 capsule   Orders Placed This Encounter  Procedures  . Fecal  occult blood, imunochemical    Standing Status:  Future    Standing Expiration Date:   07/14/2020  . PSA, Total with Reflex to PSA, Free    Standing Status:   Future    Standing Expiration Date:   07/14/2020    I discussed the assessment and treatment plan with the patient. The patient was provided an opportunity to ask questions and all were answered. The patient agreed with the plan and demonstrated an understanding of the instructions. The patient was advised to call back or seek an in-person evaluation if the symptoms worsen or if the condition fails to improve as anticipated.  Follow up plan: No follow-ups on file.  Eustaquio Boyden, MD

## 2019-08-20 ENCOUNTER — Other Ambulatory Visit: Payer: Self-pay | Admitting: Family Medicine

## 2019-08-23 ENCOUNTER — Other Ambulatory Visit: Payer: Self-pay | Admitting: Family Medicine

## 2019-09-18 ENCOUNTER — Other Ambulatory Visit: Payer: Self-pay | Admitting: Family Medicine

## 2019-09-19 MED ORDER — LISINOPRIL-HYDROCHLOROTHIAZIDE 20-12.5 MG PO TABS: 1.0000 | ORAL_TABLET | Freq: Every day | ORAL | 3 refills | Status: DC

## 2019-09-19 NOTE — Addendum Note (Signed)
Addended by: Nanci Pina on: 09/19/2019 02:33 PM   Modules accepted: Orders

## 2019-09-19 NOTE — Telephone Encounter (Signed)
E-scribed refill.  Plz schedule CPE and lab visits. 

## 2019-10-08 ENCOUNTER — Encounter: Payer: Self-pay | Admitting: Family Medicine

## 2019-10-08 ENCOUNTER — Telehealth (INDEPENDENT_AMBULATORY_CARE_PROVIDER_SITE_OTHER): Payer: Managed Care, Other (non HMO) | Admitting: Family Medicine

## 2019-10-08 ENCOUNTER — Other Ambulatory Visit: Payer: Self-pay

## 2019-10-08 DIAGNOSIS — J209 Acute bronchitis, unspecified: Secondary | ICD-10-CM | POA: Diagnosis not present

## 2019-10-08 DIAGNOSIS — N401 Enlarged prostate with lower urinary tract symptoms: Secondary | ICD-10-CM

## 2019-10-08 DIAGNOSIS — N138 Other obstructive and reflux uropathy: Secondary | ICD-10-CM | POA: Diagnosis not present

## 2019-10-08 MED ORDER — HYDROCOD POLST-CPM POLST ER 10-8 MG/5ML PO SUER: 5.0000 mL | Freq: Two times a day (BID) | ORAL | 0 refills | Status: DC | PRN
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
## 2019-10-08 NOTE — Telephone Encounter (Signed)
Seen today. 

## 2019-10-08 NOTE — Telephone Encounter (Signed)
Spoke with pt scheduling a MyChart video visit today at 2:15.

## 2019-10-08 NOTE — Assessment & Plan Note (Signed)
Improvement noted with finasteride on board. Has PSA labs scheduled for next week.

## 2019-10-14 ENCOUNTER — Other Ambulatory Visit: Payer: Managed Care, Other (non HMO)

## 2020-01-04 ENCOUNTER — Other Ambulatory Visit: Payer: Self-pay | Admitting: Family Medicine

## 2020-01-06 NOTE — Telephone Encounter (Signed)
E-scribed refill.  Plz schedule cpe and lab visits.  

## 2020-01-20 NOTE — Telephone Encounter (Signed)
Called patient to get scheduled for CPE and labs and he stated he will give [place] a call back to get scheduled.

## 2020-01-31 ENCOUNTER — Other Ambulatory Visit: Payer: Self-pay | Admitting: Family Medicine

## 2020-01-31 NOTE — Telephone Encounter (Signed)
E-scribed refill.  Plz schedule cpe and lab visits.  

## 2020-02-29 ENCOUNTER — Other Ambulatory Visit: Payer: Self-pay | Admitting: Family Medicine

## 2020-03-26 ENCOUNTER — Other Ambulatory Visit: Payer: Self-pay | Admitting: Family Medicine

## 2020-04-25 ENCOUNTER — Other Ambulatory Visit: Payer: Self-pay | Admitting: Family Medicine

## 2020-05-29 ENCOUNTER — Telehealth: Payer: Self-pay | Admitting: Family Medicine

## 2020-05-29 NOTE — Telephone Encounter (Signed)
E-scribed refill.  Plz schedule lab and cpe visits.  

## 2020-06-22 ENCOUNTER — Other Ambulatory Visit: Payer: Self-pay | Admitting: Family Medicine

## 2020-07-03 ENCOUNTER — Encounter: Payer: Self-pay | Admitting: Family Medicine

## 2020-07-03 NOTE — Telephone Encounter (Signed)
Noted  

## 2020-07-03 NOTE — Telephone Encounter (Signed)
Called and left vm for the patient To call and schedule CPE and labs. EM

## 2020-07-21 ENCOUNTER — Other Ambulatory Visit: Payer: Self-pay | Admitting: Family Medicine

## 2020-07-21 NOTE — Telephone Encounter (Signed)
Pharmacy requests refill on: Sildenafil 50 mg   LAST REFILL: 06/24/2020 (Q-6, R-0) LAST OV: 07/15/2019 NEXT OV: Not Scheduled  PHARMACY: [id number] [place], [place]

## 2020-08-12 ENCOUNTER — Other Ambulatory Visit: Payer: Self-pay | Admitting: Family Medicine

## 2020-09-20 ENCOUNTER — Other Ambulatory Visit: Payer: Self-pay | Admitting: Family Medicine

## 2020-09-20 DIAGNOSIS — E785 Hyperlipidemia, unspecified: Secondary | ICD-10-CM

## 2020-09-20 DIAGNOSIS — E559 Vitamin D deficiency, unspecified: Secondary | ICD-10-CM

## 2020-09-20 DIAGNOSIS — R7303 Prediabetes: Secondary | ICD-10-CM

## 2020-09-20 DIAGNOSIS — R972 Elevated prostate specific antigen [PSA]: Secondary | ICD-10-CM

## 2020-09-22 ENCOUNTER — Other Ambulatory Visit: Payer: Self-pay

## 2020-09-22 ENCOUNTER — Other Ambulatory Visit (INDEPENDENT_AMBULATORY_CARE_PROVIDER_SITE_OTHER): Payer: Managed Care, Other (non HMO)

## 2020-09-22 DIAGNOSIS — Z125 Encounter for screening for malignant neoplasm of prostate: Secondary | ICD-10-CM | POA: Diagnosis not present

## 2020-09-22 DIAGNOSIS — E559 Vitamin D deficiency, unspecified: Secondary | ICD-10-CM

## 2020-09-22 DIAGNOSIS — E785 Hyperlipidemia, unspecified: Secondary | ICD-10-CM

## 2020-09-22 DIAGNOSIS — R972 Elevated prostate specific antigen [PSA]: Secondary | ICD-10-CM

## 2020-09-22 DIAGNOSIS — R7303 Prediabetes: Secondary | ICD-10-CM

## 2020-09-22 NOTE — Addendum Note (Signed)
Addended by: Alvina Chou on: 09/22/2020 07:35 AM   Modules accepted: Orders

## 2020-09-23 LAB — HEMOGLOBIN A1C
Est. average glucose Bld gHb Est-mCnc: 126 mg/dL
Hgb A1c MFr Bld: 6 % — ABNORMAL HIGH (ref 4.8–5.6)

## 2020-09-23 LAB — LIPID PANEL
Chol/HDL Ratio: 4.6 ratio (ref 0.0–5.0)
Cholesterol, Total: 133 mg/dL (ref 100–199)
HDL: 29 mg/dL — ABNORMAL LOW (ref >39)
LDL Chol Calc (NIH): 52 mg/dL (ref 0–99)
Triglycerides: 342 mg/dL — ABNORMAL HIGH (ref 0–149)
VLDL Cholesterol Cal: 52 mg/dL — ABNORMAL HIGH (ref 5–40)

## 2020-09-23 LAB — COMPREHENSIVE METABOLIC PANEL
ALT: 35 IU/L (ref 0–44)
AST: 28 IU/L (ref 0–40)
Albumin/Globulin Ratio: 1.7 (ref 1.2–2.2)
Albumin: 4.2 g/dL (ref 3.8–4.8)
Alkaline Phosphatase: 96 IU/L (ref 44–121)
BUN/Creatinine Ratio: 12 (ref 10–24)
BUN: 16 mg/dL (ref 8–27)
Bilirubin Total: 1.3 mg/dL — ABNORMAL HIGH (ref 0.0–1.2)
CO2: 24 mmol/L (ref 20–29)
Calcium: 9.7 mg/dL (ref 8.6–10.2)
Chloride: 99 mmol/L (ref 96–106)
Creatinine, Ser: 1.36 mg/dL — ABNORMAL HIGH (ref 0.76–1.27)
Globulin, Total: 2.5 g/dL (ref 1.5–4.5)
Glucose: 122 mg/dL — ABNORMAL HIGH (ref 65–99)
Potassium: 4.2 mmol/L (ref 3.5–5.2)
Sodium: 138 mmol/L (ref 134–144)
Total Protein: 6.7 g/dL (ref 6.0–8.5)
eGFR: 59 mL/min/{1.73_m2} — ABNORMAL LOW (ref >59)

## 2020-09-23 LAB — PSA, TOTAL AND FREE
PSA, Free Pct: 18.1 %
PSA, Free: 1.27 ng/mL
Prostate Specific Ag, Serum: 7 ng/mL — ABNORMAL HIGH (ref 0.0–4.0)

## 2020-09-23 LAB — VITAMIN D 25 HYDROXY (VIT D DEFICIENCY, FRACTURES): Vit D, 25-Hydroxy: 19.2 ng/mL — ABNORMAL LOW (ref 30.0–100.0)

## 2020-09-29 ENCOUNTER — Other Ambulatory Visit: Payer: Self-pay

## 2020-09-29 ENCOUNTER — Ambulatory Visit (INDEPENDENT_AMBULATORY_CARE_PROVIDER_SITE_OTHER): Payer: Managed Care, Other (non HMO) | Admitting: Family Medicine

## 2020-09-29 ENCOUNTER — Encounter: Payer: Self-pay | Admitting: Family Medicine

## 2020-09-29 VITALS — BP 122/80 | HR 88 | Temp 97.6°F | Ht 68.5 in | Wt 226.0 lb

## 2020-09-29 DIAGNOSIS — N138 Other obstructive and reflux uropathy: Secondary | ICD-10-CM

## 2020-09-29 DIAGNOSIS — Z1211 Encounter for screening for malignant neoplasm of colon: Secondary | ICD-10-CM

## 2020-09-29 DIAGNOSIS — R972 Elevated prostate specific antigen [PSA]: Secondary | ICD-10-CM

## 2020-09-29 DIAGNOSIS — E669 Obesity, unspecified: Secondary | ICD-10-CM

## 2020-09-29 DIAGNOSIS — N401 Enlarged prostate with lower urinary tract symptoms: Secondary | ICD-10-CM

## 2020-09-29 DIAGNOSIS — Z0000 Encounter for general adult medical examination without abnormal findings: Secondary | ICD-10-CM | POA: Diagnosis not present

## 2020-09-29 DIAGNOSIS — I10 Essential (primary) hypertension: Secondary | ICD-10-CM

## 2020-09-29 DIAGNOSIS — E559 Vitamin D deficiency, unspecified: Secondary | ICD-10-CM

## 2020-09-29 DIAGNOSIS — E782 Mixed hyperlipidemia: Secondary | ICD-10-CM

## 2020-09-29 DIAGNOSIS — R7303 Prediabetes: Secondary | ICD-10-CM

## 2020-09-29 MED ORDER — LISINOPRIL-HYDROCHLOROTHIAZIDE 20-12.5 MG PO TABS: 1.0000 | ORAL_TABLET | Freq: Every day | ORAL | 3 refills | Status: AC

## 2020-09-29 MED ORDER — ATORVASTATIN CALCIUM 20 MG PO TABS: 1.0000 | ORAL_TABLET | Freq: Every day | ORAL | 3 refills | Status: DC

## 2020-09-29 MED ORDER — TAMSULOSIN HCL 0.4 MG PO CAPS: 0.4000 mg | ORAL_CAPSULE | Freq: Every day | ORAL | 3 refills | Status: DC

## 2020-09-29 MED ORDER — FINASTERIDE 5 MG PO TABS
5.0000 mg | ORAL_TABLET | Freq: Every day | ORAL | 3 refills | Status: DC
Start: 2020-09-29 — End: 2021-07-12

## 2020-09-29 NOTE — Assessment & Plan Note (Signed)
Thought BPH related.  Reviewed today's labwork.

## 2020-09-29 NOTE — Assessment & Plan Note (Signed)
Chronic, stable on statin. Continue. The 10-year ASCVD risk score Denman George DC Montez Hageman., et al., 2013) is: 10.3%   Values used to calculate the score:     Age: 60 years     Sex: Male     Is Non-Hispanic African American: No     Diabetic: No     Tobacco smoker: No     Systolic Blood Pressure: 122 mmHg     Is BP treated: Yes     HDL Cholesterol: 29 mg/dL     Total Cholesterol: 133 mg/dL
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
## 2020-09-29 NOTE — Assessment & Plan Note (Signed)
Chronic, stable. Continue current regimen. 

## 2020-09-29 NOTE — Patient Instructions (Addendum)
Pass by lab to pick up stool kit. Schedule nurse visit when ready for shingles shots.  Trial flomax + finasteride for prostate. If doing well after 2-3 months, may stop flomax.  Return as needed or in 1 year for next physical.   Health Maintenance, Male Adopting a healthy lifestyle and getting preventive care are important in promoting health and wellness. Ask your health care provider about:  The right schedule for you to have regular tests and exams.  Things you can do on your own to prevent diseases and keep yourself healthy. What should I know about diet, weight, and exercise? Eat a healthy diet  Eat a diet that includes plenty of vegetables, fruits, low-fat dairy products, and lean protein.  Do not eat a lot of foods that are high in solid fats, added sugars, or sodium.   Maintain a healthy weight Body mass index (BMI) is a measurement that can be used to identify possible weight problems. It estimates body fat based on height and weight. Your health care provider can help determine your BMI and help you achieve or maintain a healthy weight. Get regular exercise Get regular exercise. This is one of the most important things you can do for your health. Most adults should:  Exercise for at least 150 minutes each week. The exercise should increase your heart rate and make you sweat (moderate-intensity exercise).  Do strengthening exercises at least twice a week. This is in addition to the moderate-intensity exercise.  Spend less time sitting. Even light physical activity can be beneficial. Watch cholesterol and blood lipids Have your blood tested for lipids and cholesterol at 62 years of age, then have this test every 5 years. You may need to have your cholesterol levels checked more often if:  Your lipid or cholesterol levels are high.  You are older than 62 years of age.  You are at high risk for heart disease. What should I know about cancer screening? Many types of cancers  can be detected early and may often be prevented. Depending on your health history and family history, you may need to have cancer screening at various ages. This may include screening for:  Colorectal cancer.  Prostate cancer.  Skin cancer.  Lung cancer. What should I know about heart disease, diabetes, and high blood pressure? Blood pressure and heart disease  High blood pressure causes heart disease and increases the risk of stroke. This is more likely to develop in people who have high blood pressure readings, are of African descent, or are overweight.  Talk with your health care provider about your target blood pressure readings.  Have your blood pressure checked: ? Every 3-5 years if you are 68-62 years of age. ? Every year if you are 21 years old or older.  If you are between the ages of 90 and 34 and are a current or former smoker, ask your health care provider if you should have a one-time screening for abdominal aortic aneurysm (AAA). Diabetes Have regular diabetes screenings. This checks your fasting blood sugar level. Have the screening done:  Once every three years after age 11 if you are at a normal weight and have a low risk for diabetes.  More often and at a younger age if you are overweight or have a high risk for diabetes. What should I know about preventing infection? Hepatitis B If you have a higher risk for hepatitis B, you should be screened for this virus. Talk with your health care provider  to find out if you are at risk for hepatitis B infection. Hepatitis C Blood testing is recommended for:  Everyone born from [birth year] through 1967.  Anyone with known risk factors for hepatitis C. Sexually transmitted infections (STIs)  You should be screened each year for STIs, including gonorrhea and chlamydia, if: ? You are sexually active and are younger than 62 years of age. ? You are older than 62 years of age and your health care provider tells you that you are at  risk for this type of infection. ? Your sexual activity has changed since you were last screened, and you are at increased risk for chlamydia or gonorrhea. Ask your health care provider if you are at risk.  Ask your health care provider about whether you are at high risk for HIV. Your health care provider may recommend a prescription medicine to help prevent HIV infection. If you choose to take medicine to prevent HIV, you should first get tested for HIV. You should then be tested every 3 months for as long as you are taking the medicine. Follow these instructions at home: Lifestyle  Do not use any products that contain nicotine or tobacco, such as cigarettes, e-cigarettes, and chewing tobacco. If you need help quitting, ask your health care provider.  Do not use street drugs.  Do not share needles.  Ask your health care provider for help if you need support or information about quitting drugs. Alcohol use  Do not drink alcohol if your health care provider tells you not to drink.  If you drink alcohol: ? Limit how much you have to 0-2 drinks a day. ? Be aware of how much alcohol is in your drink. In the [place] one drink equals one 12 oz bottle of beer (355 mL), one 5 oz glass of wine (148 mL), or one 1 oz glass of hard liquor (44 mL). General instructions  Schedule regular health, dental, and eye exams.  Stay current with your vaccines.  Tell your health care provider if: ? You often feel depressed. ? You have ever been abused or do not feel safe at home. Summary  Adopting a healthy lifestyle and getting preventive care are important in promoting health and wellness.  Follow your health care provider's instructions about healthy diet, exercising, and getting tested or screened for diseases.  Follow your health care provider's instructions on monitoring your cholesterol and blood pressure. This information is not intended to replace advice given to you by your health care  provider. Make sure you discuss any questions you have with your health care provider. Document Revised: 05/30/2018 Document Reviewed: 05/30/2018 Elsevier Patient Education  2021 [employer].

## 2020-09-29 NOTE — Assessment & Plan Note (Signed)
Preventative protocols reviewed and updated unless pt declined. Discussed healthy diet and lifestyle.  

## 2020-09-29 NOTE — Assessment & Plan Note (Addendum)
Encouraged healthy diet and lifestyle choices to affect sustainable weight loss. 20 lb weight gain noted.

## 2020-09-29 NOTE — Assessment & Plan Note (Signed)
Encouraged limiting added sugars.  

## 2020-09-29 NOTE — Assessment & Plan Note (Signed)
Trial flomax + finasteride.  Declines return to uro.

## 2020-09-29 NOTE — Assessment & Plan Note (Addendum)
rec start vit D 1000 IU daily.  

## 2020-11-19 ENCOUNTER — Other Ambulatory Visit: Payer: Self-pay | Admitting: Family Medicine

## 2021-03-27 ENCOUNTER — Other Ambulatory Visit: Payer: Self-pay | Admitting: Family Medicine

## 2021-03-29 NOTE — Telephone Encounter (Signed)
Refill request Sildenafil Last refill 11/19/20 #6/3 Last office visit 09/29/20

## 2021-05-24 ENCOUNTER — Other Ambulatory Visit: Payer: Self-pay

## 2021-05-24 ENCOUNTER — Encounter (HOSPITAL_COMMUNITY): Payer: Self-pay | Admitting: Emergency Medicine

## 2021-05-24 ENCOUNTER — Emergency Department (HOSPITAL_COMMUNITY)
Admission: EM | Admit: 2021-05-24 | Discharge: 2021-05-24 | Disposition: A | Discharge status: Home / Self Care | Payer: Managed Care, Other (non HMO) | Attending: Emergency Medicine | Admitting: Emergency Medicine

## 2021-05-24 DIAGNOSIS — I10 Essential (primary) hypertension: Secondary | ICD-10-CM | POA: Insufficient documentation

## 2021-05-24 DIAGNOSIS — Z79899 Other long term (current) drug therapy: Secondary | ICD-10-CM | POA: Diagnosis not present

## 2021-05-24 DIAGNOSIS — T59811A Toxic effect of smoke, accidental (unintentional), initial encounter: Secondary | ICD-10-CM | POA: Diagnosis present
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
## 2021-07-12 ENCOUNTER — Other Ambulatory Visit: Payer: Self-pay | Admitting: Family Medicine

## 2021-08-08 ENCOUNTER — Other Ambulatory Visit: Payer: Self-pay | Admitting: Family Medicine

## 2021-08-09 NOTE — Telephone Encounter (Signed)
E-scribed refills.  Plz schedule lab and cpe visits for additional refills.  

## 2021-09-12 ENCOUNTER — Other Ambulatory Visit: Payer: Self-pay | Admitting: Family Medicine

## 2021-12-10 ENCOUNTER — Other Ambulatory Visit: Payer: Self-pay | Admitting: Family Medicine

## 2021-12-13 ENCOUNTER — Other Ambulatory Visit: Payer: Self-pay | Admitting: Family Medicine

## 2022-02-10 ENCOUNTER — Other Ambulatory Visit: Payer: Self-pay | Admitting: Family Medicine

## 2022-02-11 NOTE — Telephone Encounter (Signed)
LVM for patient to call and schedule

## 2022-02-11 NOTE — Telephone Encounter (Signed)
Please call patient and schedule physical. Can schedule in a 30 minute appointment. Send back to CMA for refill after appointment is scheduled. Last office visit 09/29/20

## 2022-02-14 NOTE — Telephone Encounter (Signed)
Last CPE was 09/29/21. Needs CPE for additional refills.

## 2022-05-14 ENCOUNTER — Other Ambulatory Visit: Payer: Self-pay | Admitting: Family Medicine

## 2022-05-16 NOTE — Telephone Encounter (Signed)
Please call patient and schedule appointment. Last office visit 09/29/20 Send back for refill after appointment is scheduled.

## 2022-05-17 NOTE — Telephone Encounter (Signed)
Attempted to call patient,did not get an answer

## 2022-05-18 NOTE — Telephone Encounter (Signed)
Overdue for CPE and labs.  Needs OV.
